# Patient Record
Sex: Female | Born: 1935 | Race: White | Hispanic: No | Smoking: Former smoker
Health system: Southern US, Community
[De-identification: ages and names within clinical notes are randomized; demographics above are authoritative.]

## PROBLEM LIST (undated history)

## (undated) DIAGNOSIS — M810 Age-related osteoporosis without current pathological fracture: Secondary | ICD-10-CM

## (undated) DIAGNOSIS — J45909 Unspecified asthma, uncomplicated: Secondary | ICD-10-CM

## (undated) DIAGNOSIS — M1990 Unspecified osteoarthritis, unspecified site: Secondary | ICD-10-CM

## (undated) DIAGNOSIS — E079 Disorder of thyroid, unspecified: Secondary | ICD-10-CM

## (undated) DIAGNOSIS — E119 Type 2 diabetes mellitus without complications: Secondary | ICD-10-CM

## (undated) DIAGNOSIS — J439 Emphysema, unspecified: Secondary | ICD-10-CM

## (undated) DIAGNOSIS — T7840XA Allergy, unspecified, initial encounter: Secondary | ICD-10-CM

## (undated) DIAGNOSIS — J449 Chronic obstructive pulmonary disease, unspecified: Secondary | ICD-10-CM

## (undated) HISTORY — DX: Disorder of thyroid, unspecified: E07.9

## (undated) HISTORY — DX: Allergy, unspecified, initial encounter: T78.40XA

## (undated) HISTORY — DX: Unspecified asthma, uncomplicated: J45.909

## (undated) HISTORY — DX: Emphysema, unspecified: J43.9

## (undated) HISTORY — PX: BREAST BIOPSY: SHX20

## (undated) HISTORY — PX: ABDOMINAL HYSTERECTOMY: SHX81

## (undated) HISTORY — PX: CHOLECYSTECTOMY: SHX55

## (undated) HISTORY — DX: Type 2 diabetes mellitus without complications: E11.9

## (undated) HISTORY — DX: Chronic obstructive pulmonary disease, unspecified: J44.9

## (undated) HISTORY — DX: Unspecified osteoarthritis, unspecified site: M19.90

## (undated) HISTORY — DX: Age-related osteoporosis without current pathological fracture: M81.0

---

## 1998-06-23 ENCOUNTER — Emergency Department (HOSPITAL_COMMUNITY): Admission: EM | Admit: 1998-06-23 | Discharge: 1998-06-23 | Payer: Self-pay | Admitting: Emergency Medicine

## 1998-06-23 ENCOUNTER — Encounter: Payer: Self-pay | Admitting: Emergency Medicine

## 2000-04-23 ENCOUNTER — Encounter: Payer: Self-pay | Admitting: *Deleted

## 2000-04-23 ENCOUNTER — Encounter: Admission: RE | Admit: 2000-04-23 | Discharge: 2000-04-23 | Payer: Self-pay | Admitting: *Deleted

## 2000-09-07 ENCOUNTER — Encounter: Payer: Self-pay | Admitting: Internal Medicine

## 2000-09-07 ENCOUNTER — Encounter: Admission: RE | Admit: 2000-09-07 | Discharge: 2000-09-07 | Payer: Self-pay | Admitting: Internal Medicine

## 2000-09-24 ENCOUNTER — Ambulatory Visit (HOSPITAL_COMMUNITY): Admission: RE | Admit: 2000-09-24 | Discharge: 2000-09-24 | Payer: Self-pay | Admitting: Gastroenterology

## 2001-04-17 ENCOUNTER — Ambulatory Visit (HOSPITAL_COMMUNITY): Admission: RE | Admit: 2001-04-17 | Discharge: 2001-04-17 | Payer: Self-pay | Admitting: Gastroenterology

## 2001-04-18 ENCOUNTER — Encounter: Payer: Self-pay | Admitting: Internal Medicine

## 2001-04-18 ENCOUNTER — Encounter: Admission: RE | Admit: 2001-04-18 | Discharge: 2001-04-18 | Payer: Self-pay | Admitting: Internal Medicine

## 2001-04-24 ENCOUNTER — Encounter: Payer: Self-pay | Admitting: Internal Medicine

## 2001-04-24 ENCOUNTER — Encounter: Admission: RE | Admit: 2001-04-24 | Discharge: 2001-04-24 | Payer: Self-pay | Admitting: Internal Medicine

## 2002-02-13 ENCOUNTER — Encounter: Admission: RE | Admit: 2002-02-13 | Discharge: 2002-02-13 | Payer: Self-pay | Admitting: Internal Medicine

## 2002-02-13 ENCOUNTER — Encounter: Payer: Self-pay | Admitting: Internal Medicine

## 2002-05-02 ENCOUNTER — Encounter: Payer: Self-pay | Admitting: Internal Medicine

## 2002-05-02 ENCOUNTER — Encounter: Admission: RE | Admit: 2002-05-02 | Discharge: 2002-05-02 | Payer: Self-pay | Admitting: Internal Medicine

## 2002-07-29 ENCOUNTER — Encounter: Admission: RE | Admit: 2002-07-29 | Discharge: 2002-07-29 | Payer: Self-pay | Admitting: Internal Medicine

## 2002-07-29 ENCOUNTER — Encounter: Payer: Self-pay | Admitting: Internal Medicine

## 2002-11-04 ENCOUNTER — Encounter: Payer: Self-pay | Admitting: Internal Medicine

## 2002-11-04 ENCOUNTER — Encounter: Admission: RE | Admit: 2002-11-04 | Discharge: 2002-11-04 | Payer: Self-pay | Admitting: Internal Medicine

## 2002-11-13 ENCOUNTER — Encounter: Payer: Self-pay | Admitting: Surgery

## 2002-11-13 ENCOUNTER — Encounter: Admission: RE | Admit: 2002-11-13 | Discharge: 2002-11-13 | Payer: Self-pay | Admitting: Surgery

## 2002-12-05 ENCOUNTER — Encounter: Payer: Self-pay | Admitting: Surgery

## 2002-12-12 ENCOUNTER — Encounter (INDEPENDENT_AMBULATORY_CARE_PROVIDER_SITE_OTHER): Payer: Self-pay | Admitting: Specialist

## 2002-12-12 ENCOUNTER — Encounter: Payer: Self-pay | Admitting: Surgery

## 2002-12-12 ENCOUNTER — Observation Stay (HOSPITAL_COMMUNITY): Admission: RE | Admit: 2002-12-12 | Discharge: 2002-12-13 | Payer: Self-pay | Admitting: Surgery

## 2003-05-04 ENCOUNTER — Encounter: Payer: Self-pay | Admitting: Internal Medicine

## 2003-05-04 ENCOUNTER — Encounter: Admission: RE | Admit: 2003-05-04 | Discharge: 2003-05-04 | Payer: Self-pay | Admitting: Internal Medicine

## 2004-05-04 ENCOUNTER — Encounter: Admission: RE | Admit: 2004-05-04 | Discharge: 2004-05-04 | Payer: Self-pay | Admitting: Internal Medicine

## 2004-06-01 ENCOUNTER — Encounter: Admission: RE | Admit: 2004-06-01 | Discharge: 2004-06-01 | Payer: Self-pay | Admitting: Internal Medicine

## 2004-06-06 ENCOUNTER — Encounter: Admission: RE | Admit: 2004-06-06 | Discharge: 2004-06-06 | Payer: Self-pay | Admitting: Internal Medicine

## 2004-06-06 ENCOUNTER — Encounter (INDEPENDENT_AMBULATORY_CARE_PROVIDER_SITE_OTHER): Payer: Self-pay | Admitting: *Deleted

## 2005-07-27 ENCOUNTER — Encounter: Admission: RE | Admit: 2005-07-27 | Discharge: 2005-07-27 | Payer: Self-pay | Admitting: Internal Medicine

## 2005-09-28 ENCOUNTER — Encounter: Admission: RE | Admit: 2005-09-28 | Discharge: 2005-12-27 | Payer: Self-pay | Admitting: Internal Medicine

## 2006-08-23 ENCOUNTER — Encounter: Admission: RE | Admit: 2006-08-23 | Discharge: 2006-08-23 | Payer: Self-pay | Admitting: Internal Medicine

## 2006-10-09 ENCOUNTER — Ambulatory Visit (HOSPITAL_COMMUNITY): Admission: RE | Admit: 2006-10-09 | Discharge: 2006-10-09 | Payer: Self-pay | Admitting: Obstetrics & Gynecology

## 2006-10-25 ENCOUNTER — Ambulatory Visit (HOSPITAL_COMMUNITY): Admission: RE | Admit: 2006-10-25 | Discharge: 2006-10-25 | Payer: Self-pay | Admitting: Cardiology

## 2006-11-01 ENCOUNTER — Inpatient Hospital Stay (HOSPITAL_BASED_OUTPATIENT_CLINIC_OR_DEPARTMENT_OTHER): Admission: RE | Admit: 2006-11-01 | Discharge: 2006-11-01 | Payer: Self-pay | Admitting: Cardiology

## 2006-12-14 ENCOUNTER — Encounter (INDEPENDENT_AMBULATORY_CARE_PROVIDER_SITE_OTHER): Payer: Self-pay | Admitting: Obstetrics & Gynecology

## 2006-12-14 ENCOUNTER — Ambulatory Visit (HOSPITAL_COMMUNITY): Admission: RE | Admit: 2006-12-14 | Discharge: 2006-12-15 | Payer: Self-pay | Admitting: Obstetrics & Gynecology

## 2007-08-27 ENCOUNTER — Encounter: Admission: RE | Admit: 2007-08-27 | Discharge: 2007-08-27 | Payer: Self-pay | Admitting: Internal Medicine

## 2007-10-31 IMAGING — CT CT HEART WO/W CTA ONLY W/ CA
1 of 3 series · 12 of 20 positions shown, 15 images · IV contrast (omnipaque)
Comparison: none

Addendum Begins
Original report by Dr. Horlalekan Poly.  Following addendum by Dr. Reuque on 10/25/06: 
OVER-READ INTERPRETATION - CHEST CT:
The following report provided by [REDACTED] is an over-read interpretation of the non-cardiac portion of this coronary CTA study.  This does not include interpretation of cardiac or coronary anatomy or pathology.
Extracardiac Findings:  Scarring in the lingula adjacent to the major fissure on the left.  Minimal scarring in the right middle lobe and both lower lobes.  Pulmonary parenchyma otherwise clear.  Thoracic aortic atherosclerosis without aneurysm.  Normal sized hilar lymph nodes; no significant lymphadenopathy within the visualized mediastinum.  Small hiatal hernia.  Visualized upper abdomen otherwise unremarkable for early arterial phase of enhancement.
CLINICAL DATA: 79-year-old white female with history of hypertension, hypercholesterolemia, and COPD.  She is being evaluated for clearance of oophorectomy for a large ovarian cyst.  She has abnormal ECG and had prior Cardiolite study, which showed significant breast attenuation artifact.  
CORONARY CT ANGIOGRAPHY WITH CALCIUM SCORE:
PREPARATION:  The patient was premedicated with a total of 15 mg of IV Lopressor.  She received 0.4 mg sublingual nitroglycerin.  Her heart rate during the study was 60 BPM and regular.
PROCEDURE:  The patient initially had AP and lateral topograms performed.  We then performed an unenhanced CT of the heart at 3-mm collimation for calcium scoring.  The patient next received a test bolus of 20 cc of Omnipaque 350 with the region of interest placed in the ascending aorta for timing purposes.  She next received 80 cc of Omnipaque for a coronary CT angiography with a saline Pool bolus.  Images were obtained with 0.6-mm collimation, 0.4-mm overlap, and a gantry rotation of 330 milliseconds.

[Series 11: 70% only · axial · 0.40mm/px · z∈[+950,+1042]mm · 12 of 269 slices shown, 15 images]
[im 20/269  vessel]
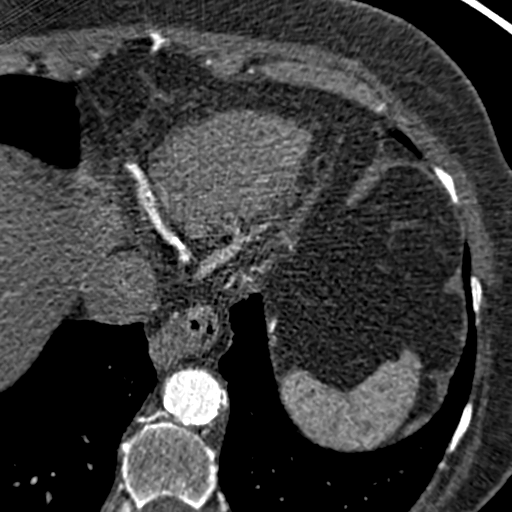
[im 20/269  lung]
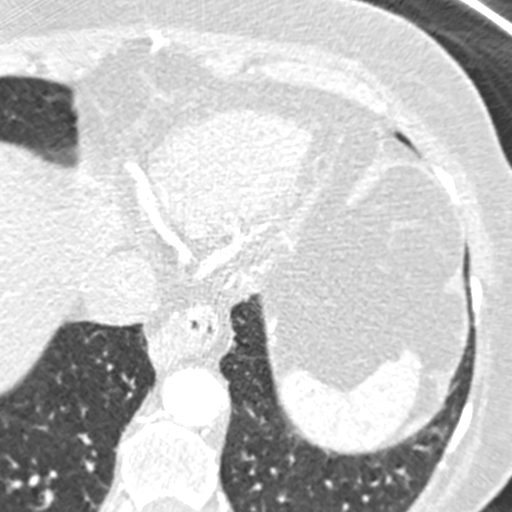
[im 39/269  vessel]
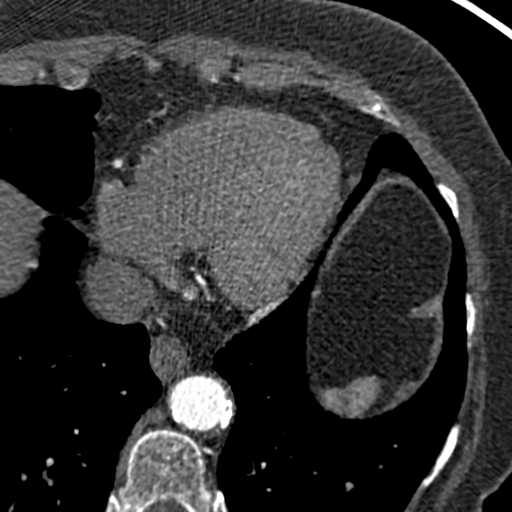
[im 58/269  vessel]
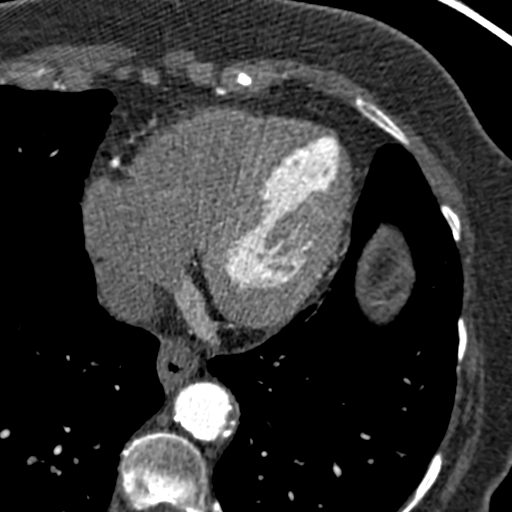
[im 77/269  vessel]
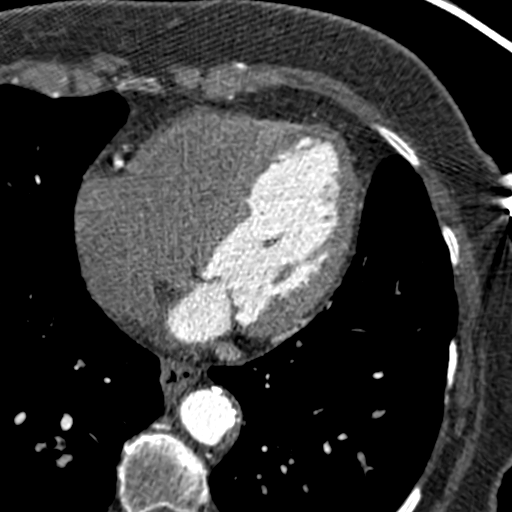
[im 96/269  vessel]
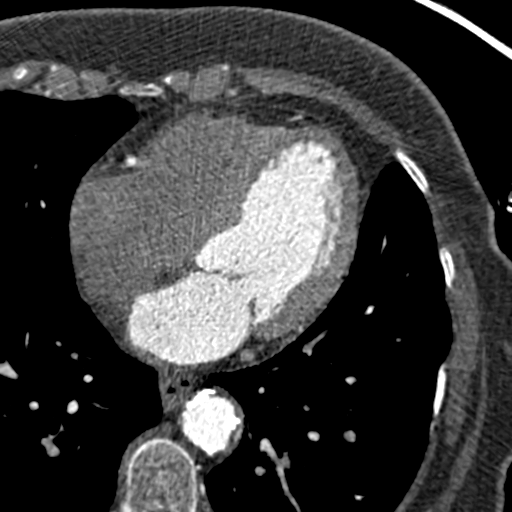
[im 96/269  lung]
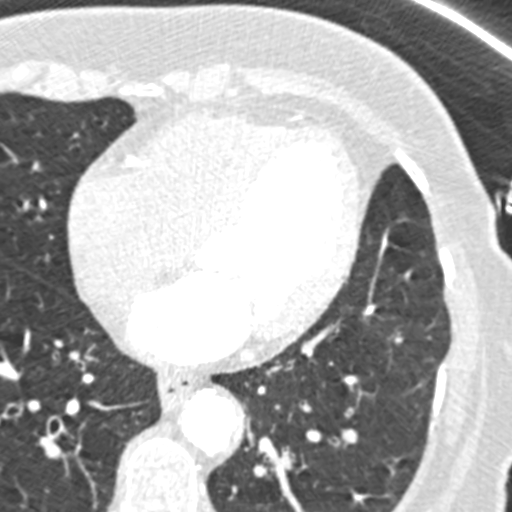
[im 115/269  vessel]
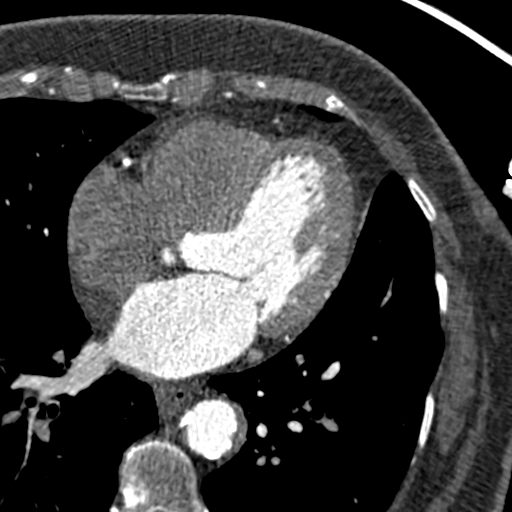
[im 154/269  vessel]
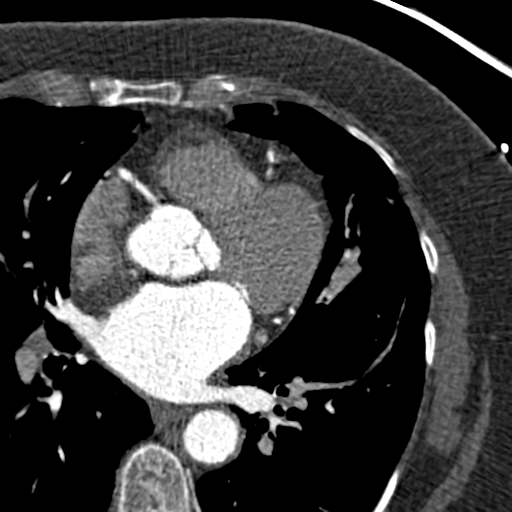
[im 173/269  vessel]
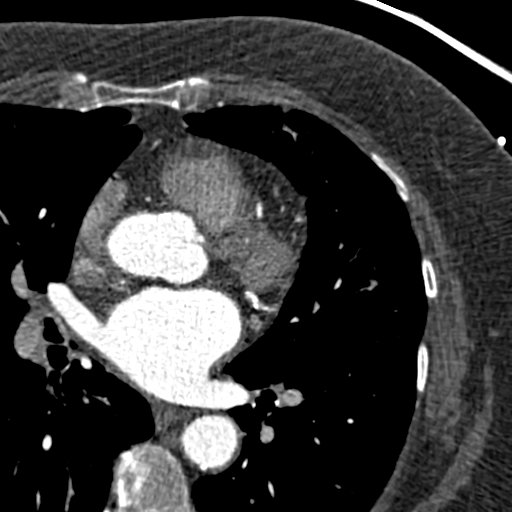
[im 192/269  vessel]
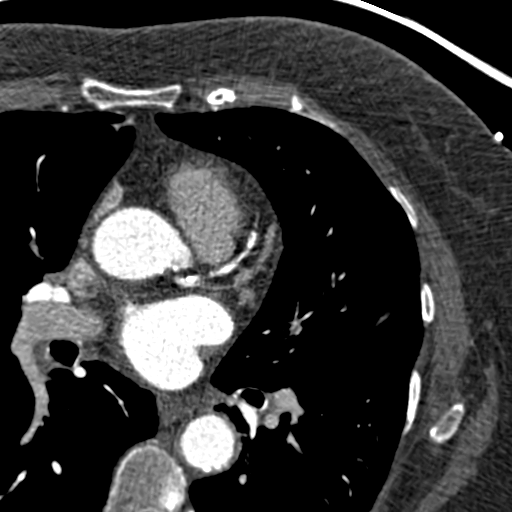
[im 192/269  lung]
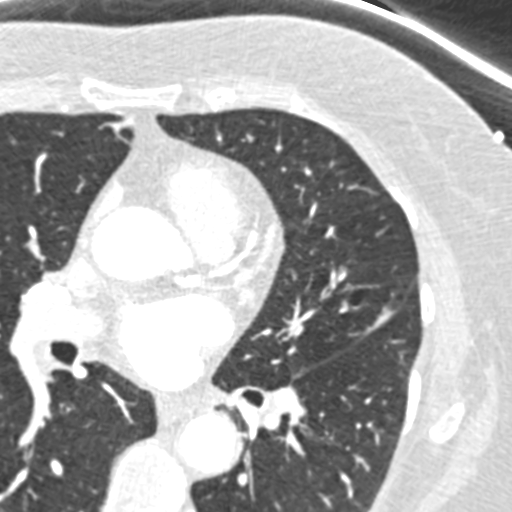
[im 211/269  vessel]
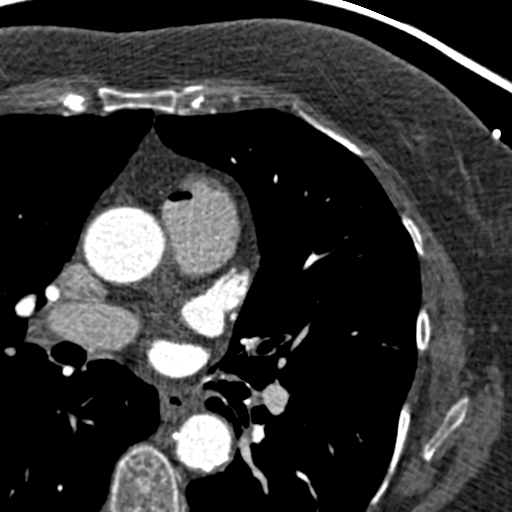
[im 230/269  vessel]
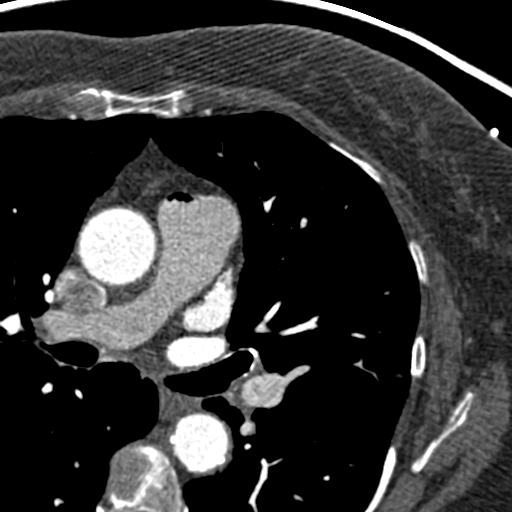
[im 249/269  vessel]
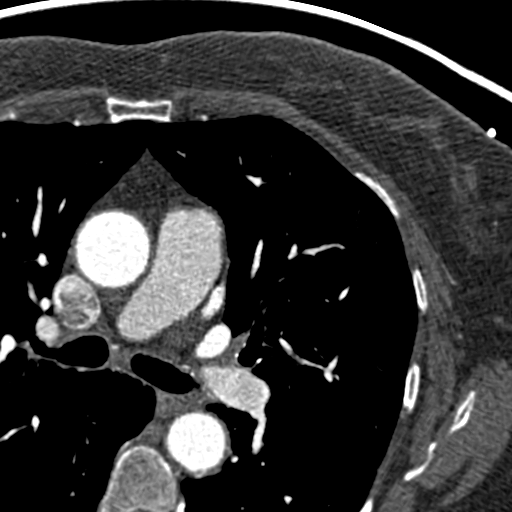

[12 of 20 positions shown; findings below may reference images not displayed]

IMPRESSION: 1.  Small hiatal hernia. 
2.  Thoracic aortic atherosclerosis without aneurysm.  
3.  Lingular scarring, with minimal scarring in the right middle lobe and both lower lobes.  

Addendum Ends
FINDINGS: The patient's calcium score was 579, which placed her in the greater to or equal to 75 percentile for her matched age.  Calcification was localized predominantly to the proximal LAD, the proximal left circumflex coronary artery, and the proximal and distal right coronary artery.
CORONARY ANATOMY:  The left coronary artery arises normally from the left coronary cusp.  The left main coronary has calcification in the ostium extending into the aortic annulus.  There is no significant plaque in the left main proper. 
The left anterior descending artery is a moderate-sized vessel, which extends out to the apex.  It has severe calcified plaque in the proximal vessel and in the mid vessel following the takeoff of the high diagonal branch.  The very proximal portion LAD clearly has a moderate amount of plaque.  Given the heavy calcification, it is really difficult to ascertain whether there is significant stenosis in this region.  Following the diagonal branch, there is also severely calcified plaque that appears to be moderate and nonobstructive.  The diagonal branch is of mild to moderate size and is without significant obstructive disease.
The left circumflex coronary artery is also severely calcified in the proximal vessel; however, this plaque appears to be nonobstructive.  It branches and gives rise to two moderate-sized marginal vessels that are without significant disease.  There is mild calcified plaque at the bifurcation of the marginal vessels, which again is nonobstructive. 
The right coronary artery arises normally from the right coronary cusp.  This is the large dominant vessel.  There is calcified nonobstructive plaque in the proximal vessel.  There is minor calcified plaque in the mid vessel.  At the distal vessel prior to the bifurcation of the PDA, there is heavily calcified plaque.  Again, this appears to be moderate but nonobstructive.  The PDA and posterolateral branches appear to be without significant disease.
Left ventricular analysis was performed using time volume analysis.  This demonstrates normal left ventricular size.  End-diastolic volume is 70 mL with an end-systolic volume of 16 mL.  Ejection fraction is calculated at 77%.  There is normal regional and global wall motion.
There is mild to moderate aortic annular calcification.  The patient has severe calcification of the proximal and descending aorta.  The aortic dimensions are normal.  There are normal pulmonary veins.  There is no pericardial thickening or effusion.  The aortic valve is trileaflet.
IMPRESSION: 1.  Atherosclerotic coronary artery disease.  The patient has fairly extensive calcified plaque involving the proximal LAD, proximal left circumflex coronary artery, and distal right coronary artery.  The proximal circumflex, mid LAD, and right coronary artery disease appears to be nonobstructive.  Significant stenosis could not be excluded in the very proximal LAD at an area of very severe calcification. 
2.  Normal left ventricular function. 
3.  Calcium score of 579%, placing the patient in the 75th percentile for match age and sex. 
4.  Calcified proximal and descending aorta.
5.  Otherwise, normal cardiac anatomy.
The noncardiac portions of this CT scan will be interpreted by Dr. Ljubijankic Nakic of [REDACTED] and added as an addendum to this report.

## 2008-01-08 ENCOUNTER — Encounter: Admission: RE | Admit: 2008-01-08 | Discharge: 2008-01-08 | Payer: Self-pay | Admitting: Internal Medicine

## 2008-03-02 ENCOUNTER — Ambulatory Visit: Admission: RE | Admit: 2008-03-02 | Discharge: 2008-03-02 | Payer: Self-pay | Admitting: Internal Medicine

## 2008-08-17 ENCOUNTER — Encounter: Admission: RE | Admit: 2008-08-17 | Discharge: 2008-08-17 | Payer: Self-pay | Admitting: Internal Medicine

## 2008-08-27 ENCOUNTER — Encounter: Admission: RE | Admit: 2008-08-27 | Discharge: 2008-08-27 | Payer: Self-pay | Admitting: Internal Medicine

## 2008-09-07 ENCOUNTER — Encounter: Admission: RE | Admit: 2008-09-07 | Discharge: 2008-09-07 | Payer: Self-pay | Admitting: Internal Medicine

## 2009-09-08 ENCOUNTER — Encounter: Admission: RE | Admit: 2009-09-08 | Discharge: 2009-09-08 | Payer: Self-pay | Admitting: Internal Medicine

## 2010-06-20 ENCOUNTER — Encounter: Admission: RE | Admit: 2010-06-20 | Discharge: 2010-06-20 | Payer: Self-pay | Admitting: Internal Medicine

## 2010-06-28 ENCOUNTER — Encounter: Admission: RE | Admit: 2010-06-28 | Discharge: 2010-06-28 | Payer: Self-pay | Admitting: Internal Medicine

## 2010-08-14 ENCOUNTER — Encounter: Payer: Self-pay | Admitting: Internal Medicine

## 2010-08-22 ENCOUNTER — Other Ambulatory Visit: Payer: Self-pay | Admitting: Internal Medicine

## 2010-08-22 DIAGNOSIS — Z1239 Encounter for other screening for malignant neoplasm of breast: Secondary | ICD-10-CM

## 2010-09-09 ENCOUNTER — Ambulatory Visit
Admission: RE | Admit: 2010-09-09 | Discharge: 2010-09-09 | Disposition: A | Discharge status: Home / Self Care | Payer: Medicare Other | Source: Ambulatory Visit | Attending: Internal Medicine | Admitting: Internal Medicine

## 2010-09-09 DIAGNOSIS — Z1239 Encounter for other screening for malignant neoplasm of breast: Secondary | ICD-10-CM

## 2010-12-06 NOTE — Op Note (Signed)
NAMEMARVIN, MAENZA               ACCOUNT NO.:  [id number]   MEDICAL RECORD NO.:  [id number]          PATIENT TYPE:  AMB   LOCATION:  DAY                          FACILITY:  [facility]   PHYSICIAN:  Genia Del, M.D.DATE OF BIRTH:  07-28-1926   DATE OF PROCEDURE:  12/14/2006  DATE OF DISCHARGE:                               OPERATIVE REPORT   PREOPERATIVE DIAGNOSIS:  Bilateral ovarian cysts.   POSTOPERATIVE DIAGNOSIS:  Bilateral ovarian cysts, on frozen section  benign ovarian cyst probably as mucinous and Brenner on the right ovary.  Left benign ovarian cyst, also paratubal.   PROCEDURE:  Bilateral salpingo-oophorectomy assisted with da Vinci  robot.   ASSISTANT:  Chester Holstein. Earlene Plater, M.D.   PROCEDURE:  Under general anesthesia with endotracheal intubation, the  patient is in lithotomy position.  She is prepped with Betadine on the  abdominal suprapubic vulvar and vaginal areas.  The bladder is  catheterized and the Foley is left in place, the patient is draped as  usual.  The patient is status post hysterectomy.  We go abdominally.  We  measure the first port at 28 cm from the symphysis pubis.  We infiltrate  with Marcaine 0.25% plain.  We make an incision with a scalpel over 1.5  cm, opened the aponeurosis under direct vision with the Mayo scissors  and opened the parietal peritoneum bluntly with a finger.  A pursestring  stitch of 0 Vicryl is done at the aponeurosis.  We then insert the  [place] and the camera at that level.  We inspect the abdominopelvic  cavity.  No adhesions are seen with the abdominal wall anywhere.  We,  therefore, measure all the other ports and introduce the trocars under  direct vision.  Three robotic trocars and one assistant trocars.  We  then dock the robot as usual without any difficulty and insert the  instruments and Endo shears scissors in the right hand, the bipolar  fenestrated clamp in the left hand, and a fenestrated clamp in the third  robotic arm.  We start with peritoneal washings.  We then visualize the  ovaries.  The right side has a small cyst and the left side had a larger  ovarian cyst which is about 8 cm.  We start on the right side.  We  visualize the right ureter which is in normal anatomic position.  We  cauterize and section the right infundibulopelvic ligament and reach  under the ovary all the way to detach the abdomen from the pelvic wall.  We then keep the clamp in the third arm as we proceed with the left  salpingo-oophorectomy.  We visualize the left ureter which is in normal  anatomic position.  We cauterize and section the left infundibulopelvic  ligament and continue under the ovary until the ovary and tubes are  completely detached from the wall.  No adhesion was present on either  side.  The procedure went very easily.  Hemostasis is adequate on both  sides.  We used a small bag for the right ovary and we bring it out with  a bag  through the assistant port.  We used the renal bag for the larger  left ovary and bring it out also through the assistant port.  Both are  sent for frozen section.  The results is benign ovarian and paratubal  cyst.  The definitive pathology is pending.  We then close the  aponeurosis at the level of the assistant ports with a 0 Vicryl in a  pursestring stitch.  We then put the camera in place with CO2 inflation  and we visualized all ports as the trocars are pulled out.  Hemostasis  is good at all levels.  We then attach the 0 Vicryl at the camera port  and we closed the skin at each incision with a Vicryl 4-0.  We add  Dermabond on each incision.  Hemostasis is adequate at all levels.  The  estimated blood loss was minimal.  The count of instruments and sponges  was complete.  The patient remained stable throughout the surgery and  was brought to recovery room in good stable status.      Genia Del, M.D.  Electronically Signed     ML/MEDQ  D:  12/14/2006   T:  12/14/2006  Job:  [id number]

## 2010-12-09 NOTE — Procedures (Signed)
. [facility]  Patient:    Diamond Holland, Diamond Holland Visit Number: [id number] MRN: [id number]          Service Type: END Location: ENDO Attending Physician:  Charna Elizabeth Dictated by:   Anselmo Rod, M.D. Proc. Date: 04/17/01 Admit Date:  04/17/2001   CC:         Lilly Cove, M.D.   Procedure Report  ATE OF BIRTH:  [birth year].  PROCEDURE:  Screening colonoscopy.  ENDOSCOPIST:  Anselmo Rod, M.D.  INSTRUMENT USED:  Olympus video colonoscope.  INDICATION FOR PROCEDURE:  A 77 year old white female undergoing screening colonoscopy.  Rule out colonic polyps, masses, hemorrhoids, etc.  PREPROCEDURE PREPARATION:  Informed consent was procured from the patient. The patient was fasted for eight hours prior to the procedure and prepped with a bottle of magnesium citrate and a gallon of NuLytely the night prior to the procedure.  PREPROCEDURE PHYSICAL:  VITAL SIGNS:  The patient had stable vital signs.  NECK:  Supple.  CHEST:  Clear to auscultation.  S1, S2 regular.  ABDOMEN:  Soft with normal bowel sounds.  DESCRIPTION OF PROCEDURE:  The patient was placed in the left lateral decubitus position and sedated with 50 mg of Demerol and 5 mg of Versed intravenously.  Once the patient was adequately sedate and maintained on low-flow oxygen and continuous cardiac monitoring, the Olympus video colonoscope was advanced from the rectum to the cecum with some difficulty secondary to a large amount of residual stool in the colon.  Multiple washes were done.  There was evidence of pandiverticular disease with diverticula in different stages of formation throughout the colon.  There was inspissated stool in several of the diverticular pockets.  Small internal hemorrhoids were seen on retroflexion in the rectum, and small external hemorrhoids were appreciated on anal inspection.  The appendiceal orifice and the ileocecal valve were clearly visualized  and photographed.  The patient tolerated the procedure well without complication.  IMPRESSION: 1. Small, nonbleeding internal and external hemorrhoids. 2. Pandiverticulosis. 3. No masses or polyps seen. 4. Large amount of residual stool in the colon, very small lesions could have    been missed.  RECOMMENDATIONS: 1. Repeat colorectal cancer screening is recommended in the next five to 10    years unless the patient were to develop any abnormal symptoms in the    interim. 2. A high-fiber diet has been discussed with the patient in great detail. 3. Follow-up is advised on a p.r.n. basis. Dictated by:   Anselmo Rod, M.D. Attending Physician:  Charna Elizabeth DD:  04/17/01 TD:  04/17/01 Job: [id number]

## 2010-12-09 NOTE — H&P (Signed)
Diamond Holland, Diamond Holland NO.:  [id number]   MEDICAL RECORD NO.:  [id number]           PATIENT TYPE:   LOCATION:                                 FACILITY:   PHYSICIAN:  Peter M. Swaziland, M.D.  DATE OF BIRTH:  17-Jul-1927   DATE OF ADMISSION:  11/01/2006  DATE OF DISCHARGE:                              HISTORY & PHYSICAL   HISTORY OF PRESENT ILLNESS:  Diamond Holland is a pleasant 77 year old white  female who is seen for preoperative evaluation for oophorectomy for a  large ovarian cyst.  She has a history of hypertension,  hypercholesterolemia and COPD.  She had previously been evaluated by a  adenosine Cardiolite study in September 2005 which was somewhat  equivocal since she did have large breast attenuation artifact  anteriorly.  We subsequently recommended evaluation with a cardiac CT  angiography.  This demonstrated a high calcium score of 579.  She had  fairly extensive calcified plaque in the proximal LAD, proximal  circumflex and right coronary artery.  Much of this was able to be  demonstrated to be nonobstructive however given the extensive  calcification the particularly proximal to mid LAD this area was really  not amenable to evaluation for potential stenosis.  She did have normal  left ventricular function.  Based on the findings of high calcium score  and fairly extensive coronary disease and the fact that the proximal LAD  could not be fully assessed by cardiac CT we have recommended proceeding  with diagnostic cardiac catheterization.   PAST MEDICAL HISTORY:  She has a history of prior hysterectomy and  tonsillectomy.  She has a history of tobacco abuse but quit in 2001.  She has history of COPD, hypertension, hypercholesterolemia.   SOCIAL HISTORY:  She is married.  She has three children.  She quit  smoking in 2001.  She denies alcohol use.   FAMILY HISTORY:  Father died at age 48 of cancer.  He may have also had  a myocardial infarction and  apparently did have significant angina.  Mother died at age 39 of congestive heart failure.  She also had several  mini strokes.  One brother died at age 32 with cancer.  One sister is  alive and well.   REVIEW OF SYSTEMS:  Otherwise unremarkable.   PHYSICAL EXAM:  GENERAL:  The patient is a pleasant white female in no  apparent distress.  VITAL SIGNS:  Weight is 158, blood pressure 128/70, pulse is 80 and  regular.  NECK:  She has no jugular venous distension or bruits.  LUNGS:  Lungs were clear to auscultation and percussion.  CARDIAC:  Exam reveals regular rate and rhythm without gallop, murmur,  rub or click.  ABDOMEN:  Abdomen is soft, nontender without mass or bruits.  EXTREMITIES:  Without edema.  Pulses were 2+ and symmetric.  NEUROLOGIC:  Neurologic exam is nonfocal.   LABORATORY DATA:  ECG shows COPD with no active disease.  ECG at rest  shows normal sinus rhythm with evidence of possible anterior infarct age  undetermined.   IMPRESSION:  1. Coronary  artery disease as documented by cardiac CT angiography.      Extent of stenosis in the LAD is unknown.  2. High coronary calcium score.  3. Hypertension.  4. Hypercholesterolemia.  5. COPD with prior tobacco abuse.  6. Large ovarian cyst.   PLAN:  Will proceed with diagnostic cardiac catheterization with further  therapy pending these results.           ______________________________  Peter M. [place]aziland, M.D.     PMJ/MEDQ  D:  10/30/2006  T:  10/31/2006  Job:  [id number]   cc:   Ralene Ok, M.D.

## 2010-12-09 NOTE — Cardiovascular Report (Signed)
Diamond Holland, Diamond Holland NO.:  [id number]   MEDICAL RECORD NO.:  [id number]          PATIENT TYPE:  OIB   LOCATION:  NA                           FACILITY:  [facility]   PHYSICIAN:  Peter M. Swaziland, M.D.  DATE OF BIRTH:  [birth year]   DATE OF PROCEDURE:  11/01/2006  DATE OF DISCHARGE:                            CARDIAC CATHETERIZATION   INDICATION FOR PROCEDURE:  A 77 year old white female who is being  evaluated for preoperative clearance for oophorectomy for a large  ovarian cyst.  She does have risk factors of hypertension and  hypercholesterolemia.  Cardiac CT angiography demonstrates extensive  calcified plaque in all three coronaries.  The proximal portion of the  LAD was really not able to be evaluated due to the extensive  calcification.   PROCEDURES:  Left heart catheterization, coronary and left ventricular  angiography.   EQUIPMENT USED:  A 4-French 4 cm left Judkins catheter, 4-French 3-DRC  catheter, 4-French pigtail catheter, 4-French arterial sheath.   MEDICATIONS:  Local anesthesia with 1% Xylocaine, contrast 90 cc of  Omnipaque.   HEMODYNAMIC DATA:  Aortic pressure is 138/63 with a mean of 95 mmHg.  Left ventricle pressure is 132 with a EDP of 24 mmHg.   ANGIOGRAPHIC DATA:  The left coronary arises and distributes normally.  The left main coronary artery appears normal.   The left anterior descending artery is moderately calcified in the  proximal to mid vessel.  There is 10-20% narrowing in the proximal and  mid vessel without significant obstructive disease.  The first diagonal  branch appears normal.   The left circumflex coronary is also calcified proximally.  It has 10-  20% narrowing proximally.   The right coronary is a large dominant vessel and has a 20% focal  narrowing in the proximal to mid vessel.   Left ventricular angiography was performed in the RAO view.  This  demonstrates normal left ventricular size and contractility with  normal  systolic function.  Ejection fraction is estimated at 65%.  There is no  mitral regurgitation or prolapse.   FINAL INTERPRETATION:  1. Nonobstructive atherosclerotic coronary artery disease with      coronary calcification.  2. Normal left ventricular function.   PLAN:  Would continue risk factor modification.  Based on these results,  the patient is cleared to proceed with oophorectomy and has a low  surgical risk.           ______________________________  Peter M. [place], M.D.     PMJ/MEDQ  D:  11/01/2006  T:  11/01/2006  Job:  [id number]   cc:   Ralene Ok, M.D.  Genia Del, M.D.

## 2010-12-09 NOTE — Op Note (Signed)
NAMEGABRYELA, Diamond Holland                         ACCOUNT NO.:  [id number]   MEDICAL RECORD NO.:  [id number]                   PATIENT TYPE:  AMB   LOCATION:  DAY                                  FACILITY:  [facility]   PHYSICIAN:  Currie Paris, M.D.           DATE OF BIRTH:  02/12/1927   DATE OF PROCEDURE:  12/12/2002  DATE OF DISCHARGE:                                 OPERATIVE REPORT   OFFICE MEDICAL RECORD NUMBER:  [id number]   PREOPERATIVE DIAGNOSIS:  Chronic calculus cholecystitis.   POSTOPERATIVE DIAGNOSIS:  Chronic calculus cholecystitis.   OPERATION:  Laparoscopic cholecystectomy.   SURGEON:  Currie Paris, M.D.   ASSISTANT:  Sheppard Plumber. Earlene Plater, M.D.   ANESTHESIA:  General endotracheal.   CLINICAL HISTORY:  The patient is a 77 year old lady with biliary and reflux  type symptoms, who was known to gallstones.  She elected to proceed to  laparoscopic cholecystectomy.   DESCRIPTION OF PROCEDURE:  The patient seen in the holding area and had no  further questions.  She was then taken to the operating room and after  satisfactory general endotracheal anesthesia had been obtained, the abdomen  was prepped and draped.  Marcaine 0.25% plain was used for each incision.  The umbilical incision was made first, fascia opened, the peritoneal cavity  entered under direct vision.  A pursestring was placed, the Hasson  introduced, and the abdomen insufflated to 15.   The abdomen appeared basically normal.  A 10-11 trocar was placed in the  epigastrium, and two 5 mm laterally.  The gallbladder was retracted over the  liver and the peritoneum over the cystic duct and then the triangle of Calot  opened both anteriorly and posteriorly.  The cystic duct and cystic artery  were readily identified, dissected out, and a window made behind both of  them to be sure there were no other structures back there.  The cystic  artery was clipped once as was the cystic duct being clipped near  its  junction with the gallbladder.  The cystic duct was opened, and a Cook  catheter introduced percutaneously and a cholangiography done.  The  cholangiogram basically appeared normal with good filling of the duodenum.  No filling defects in the common duct.  Filling of the right and left  hepatic ducts and a little bit of filling of the pancreatic duct.   The catheter was removed and three clips placed on the cystic duct stay  side, and it was divided.  The cystic artery had two additional clips  placed, and it was divided, leaving two on the stay side.  A single clip was  placed on what appeared to be a tiny posterior branch.  The gallbladder was  then removed from below to above with the coagulation current of the  cautery.  Just prior to disconnecting, we irrigated and made sure the bed of  the gallbladder was dry, and  there was no bleeding or bile leaks, and  everything appeared okay.   The gallbladder was disconnected and brought out the umbilical port.  The  abdomen was reinsufflated, a final check made for hemostasis.  Again,  everything looked fine.  The lateral ports were removed under direct vision.  The umbilical port was removed and the pursestring tied down.  The abdomen  was deflated through the epigastric port.  The skin was closed with 4-0  Monocryl subcuticular plus Steri-Strips.   The patient tolerated the procedure well.  There were no operative  complications, and all counts were correct.                                               Currie Paris, M.D.    CJS/MEDQ  D:  12/12/2002  T:  12/12/2002  Job:  [id number]   cc:   Wilson Singer, M.D.  [address]  [place]  [place] [address]  Fax: [phone]

## 2010-12-09 NOTE — Procedures (Signed)
[place]  Patient:    Diamond Holland, Diamond Holland                      MRN: [id number] Proc. Date: 09/24/00 Adm. Date:  [id number] Attending:  Charna Elizabeth CC:         Lilly Cove, M.D.   Procedure Report  DATE OF BIRTH:  Jan 24, 1927.  PROCEDURE:  Esophagogastroduodenoscopy.  ENDOSCOPIST:  Anselmo Rod, M.D.  INSTRUMENT USED:  Olympus video panendoscope.  INDICATION FOR PROCEDURE:  Dysphagia with a stricture seen on barium swallow in a 77 year old white female.  Dilatation is planned if needed.  PREPROCEDURE PREPARATION:  Informed consent was procured from the patient. The patient was fasted for eight hours prior to the procedure.  PREPROCEDURE PHYSICAL:  VITAL SIGNS:  The patient had stable vital signs.  NECK:  Supple.  CHEST:  Clear to auscultation.  S1, S2 regular.  ABDOMEN:  Soft with normal abdominal bowel sounds.  DESCRIPTION OF PROCEDURE:  The patient was placed in the left lateral decubitus position and sedated with 25 mg of Demerol and 4 mg of Versed intravenously.  Once the patient was adequately sedate and maintained on low-flow oxygen and continuous cardiac monitoring, the Olympus video panendoscope was advanced through the mouthpiece, over the tongue, into the esophagus under direct vision.  The entire esophagus was widely patent.  There was a wide-open Schatzkis ring seen at the Z-line.  The scope was then advanced into the stomach, where a large hiatal hernia was seen on retroflexion.  The rest of the gastric mucosa and the proximal small bowel appeared normal.  No dilatation was needed, and therefore it was not done.  IMPRESSION: 1. Widely patent esophagus. 2. Wide-open Schatzkis ring at Z-line. 3. Large hiatal hernia. 4. Normal-appearing stomach and proximal small bowel.  RECOMMENDATIONS: 1. Continue PPI for now. 2. Follow all antireflux measures and avoid nonsteroidals. 3. Outpatient follow-up in the next two  weeks for further recommendations. DD:  09/24/00 TD:  09/24/00 Job: [id number]

## 2011-03-08 ENCOUNTER — Other Ambulatory Visit: Payer: Self-pay

## 2011-04-19 ENCOUNTER — Other Ambulatory Visit: Payer: Self-pay

## 2011-08-09 ENCOUNTER — Other Ambulatory Visit: Payer: Self-pay | Admitting: Internal Medicine

## 2011-08-09 DIAGNOSIS — Z1231 Encounter for screening mammogram for malignant neoplasm of breast: Secondary | ICD-10-CM

## 2011-09-20 ENCOUNTER — Ambulatory Visit
Admission: RE | Admit: 2011-09-20 | Discharge: 2011-09-20 | Disposition: A | Discharge status: Home / Self Care | Payer: Medicare Other | Source: Ambulatory Visit | Attending: Internal Medicine | Admitting: Internal Medicine

## 2011-09-20 DIAGNOSIS — Z1231 Encounter for screening mammogram for malignant neoplasm of breast: Secondary | ICD-10-CM

## 2012-06-13 ENCOUNTER — Other Ambulatory Visit: Payer: Self-pay | Admitting: Internal Medicine

## 2012-06-13 ENCOUNTER — Ambulatory Visit
Admission: RE | Admit: 2012-06-13 | Discharge: 2012-06-13 | Disposition: A | Discharge status: Home / Self Care | Payer: Medicare Other | Source: Ambulatory Visit | Attending: Internal Medicine | Admitting: Internal Medicine

## 2012-06-13 DIAGNOSIS — J449 Chronic obstructive pulmonary disease, unspecified: Secondary | ICD-10-CM

## 2012-06-13 DIAGNOSIS — R05 Cough: Secondary | ICD-10-CM

## 2012-08-05 ENCOUNTER — Ambulatory Visit (INDEPENDENT_AMBULATORY_CARE_PROVIDER_SITE_OTHER): Payer: Medicare Other | Admitting: Family Medicine

## 2012-08-05 VITALS — BP 166/72 | HR 98 | Temp 98.7°F | Resp 16 | Ht 62.0 in | Wt 153.0 lb

## 2012-08-05 DIAGNOSIS — S61409A Unspecified open wound of unspecified hand, initial encounter: Secondary | ICD-10-CM

## 2012-08-05 DIAGNOSIS — Z23 Encounter for immunization: Secondary | ICD-10-CM

## 2012-08-05 NOTE — Patient Instructions (Addendum)
Followup with the PA this weekend as planned.

## 2012-08-05 NOTE — Progress Notes (Signed)
Subjective: Patient fell and hit her hand against a rail. She got cut on the back of the hand. She does not know when she had her last tetanus shot.  Objective: Somewhat irregular laceration about 5 cm in length across the back of her hand. It is a skin tear, but it is a little deeper than some. This was repaired with Steri-Strips by the PA Benny Lennert.  Assessment: Wound wrist  Plan: Followup with the PA as per plan

## 2012-08-09 ENCOUNTER — Other Ambulatory Visit: Payer: Self-pay | Admitting: Internal Medicine

## 2012-08-09 DIAGNOSIS — Z1231 Encounter for screening mammogram for malignant neoplasm of breast: Secondary | ICD-10-CM

## 2012-09-20 ENCOUNTER — Ambulatory Visit
Admission: RE | Admit: 2012-09-20 | Discharge: 2012-09-20 | Disposition: A | Discharge status: Home / Self Care | Payer: Medicare Other | Source: Ambulatory Visit | Attending: Internal Medicine | Admitting: Internal Medicine

## 2012-09-20 DIAGNOSIS — Z1231 Encounter for screening mammogram for malignant neoplasm of breast: Secondary | ICD-10-CM

## 2013-08-20 ENCOUNTER — Other Ambulatory Visit: Payer: Self-pay

## 2013-08-20 DIAGNOSIS — Z1231 Encounter for screening mammogram for malignant neoplasm of breast: Secondary | ICD-10-CM

## 2013-09-22 ENCOUNTER — Ambulatory Visit
Admission: RE | Admit: 2013-09-22 | Discharge: 2013-09-22 | Disposition: A | Discharge status: Home / Self Care | Payer: Self-pay | Source: Ambulatory Visit

## 2013-09-22 DIAGNOSIS — Z1231 Encounter for screening mammogram for malignant neoplasm of breast: Secondary | ICD-10-CM

## 2013-10-08 ENCOUNTER — Other Ambulatory Visit: Payer: Self-pay

## 2013-10-08 DIAGNOSIS — Z1231 Encounter for screening mammogram for malignant neoplasm of breast: Secondary | ICD-10-CM

## 2014-09-29 ENCOUNTER — Ambulatory Visit
Admission: RE | Admit: 2014-09-29 | Discharge: 2014-09-29 | Disposition: A | Discharge status: Home / Self Care | Payer: Medicare HMO | Source: Ambulatory Visit

## 2014-09-29 DIAGNOSIS — Z1231 Encounter for screening mammogram for malignant neoplasm of breast: Secondary | ICD-10-CM

## 2015-05-31 ENCOUNTER — Ambulatory Visit (INDEPENDENT_AMBULATORY_CARE_PROVIDER_SITE_OTHER): Payer: Medicare HMO | Admitting: Family Medicine

## 2015-05-31 VITALS — BP 114/60 | HR 97 | Temp 98.3°F | Resp 20 | Ht 62.0 in | Wt 158.0 lb

## 2015-05-31 DIAGNOSIS — R739 Hyperglycemia, unspecified: Secondary | ICD-10-CM | POA: Diagnosis not present

## 2015-05-31 DIAGNOSIS — R062 Wheezing: Secondary | ICD-10-CM

## 2015-05-31 DIAGNOSIS — J441 Chronic obstructive pulmonary disease with (acute) exacerbation: Secondary | ICD-10-CM

## 2015-05-31 LAB — POCT CBC
GRANULOCYTE PERCENT: 66.6 % (ref 37–80)
HEMATOCRIT: 40.2 % (ref 37.7–47.9)
Hemoglobin: 13.7 g/dL (ref 12.2–16.2)
LYMPH, POC: 2.5 (ref 0.6–3.4)
MCH, POC: 29.9 pg (ref 27–31.2)
MCHC: 34.2 g/dL (ref 31.8–35.4)
MCV: 87.3 fL (ref 80–97)
MID (CBC): 1.1 — AB (ref 0–0.9)
MPV: 7 fL (ref 0–99.8)
POC GRANULOCYTE: 7.3 — AB (ref 2–6.9)
POC LYMPH %: 23 % (ref 10–50)
POC MID %: 10.4 %M (ref 0–12)
Platelet Count, POC: 268 10*3/uL (ref 142–424)
RBC: 4.6 M/uL (ref 4.04–5.48)
RDW, POC: 14 %
WBC: 10.9 10*3/uL — AB (ref 4.6–10.2)

## 2015-05-31 LAB — GLUCOSE, POCT (MANUAL RESULT ENTRY): POC GLUCOSE: 95 mg/dL (ref 70–99)

## 2015-05-31 MED ORDER — ALBUTEROL SULFATE (2.5 MG/3ML) 0.083% IN NEBU
2.5000 mg | INHALATION_SOLUTION | Freq: Once | RESPIRATORY_TRACT | Status: AC
  Administered 2015-05-31: 2.5 mg via RESPIRATORY_TRACT

## 2015-05-31 MED ORDER — ALBUTEROL SULFATE HFA 108 (90 BASE) MCG/ACT IN AERS: 2.0000 | INHALATION_SPRAY | RESPIRATORY_TRACT | Status: DC | PRN

## 2015-05-31 MED ORDER — AMOXICILLIN 875 MG PO TABS
875.0000 mg | ORAL_TABLET | Freq: Two times a day (BID) | ORAL | Status: DC
Start: 2015-05-31 — End: 2023-01-06

## 2015-05-31 MED ORDER — PREDNISONE 20 MG PO TABS: ORAL_TABLET | ORAL | Status: DC

## 2015-05-31 NOTE — Patient Instructions (Signed)
Take Amoxicillin 875 twice daily for 10 days  Use Albuterol inhaler 2 puffs 4 times daily as needed for cough and wheeze  Take Prednisone 2 daily for 3 days, then 1 for 3 days, then 1/2 daily.  Take after morning meal.  Return or see your primary care doctor as needed

## 2015-05-31 NOTE — Progress Notes (Signed)
Patient ID: Diamond Holland, female    DOB: 1927/03/12  Age: 81 y.o. MRN: [id number]  Chief Complaint  Patient presents with  . Cough    Onset 2 weeks  . Sinusitis  . Back Pain    Left side onset 2-3 days  . Fatigue    Onset 1 week    Subjective:   2 wk history of congestion.  2 rounds of mucinex cold/flu.  Wheezes.  Coughing yellow phlegm.  History of COPD.  No fever.  Runny nose.  No ST or ear problem.  Uses symbicort.  Lives alone.  Children all sick.  Current allergies, medications, problem list, past/family and social histories reviewed.  Objective:  BP 114/60 mmHg  Pulse 97  Temp(Src) 98.3 F (36.8 C) (Oral)  Resp 20  Ht  (1.575 m)  Wt 158 lb (71.668 kg)  BMI 28.89 kg/m2  SpO2 96% NAD.  Hearing aids.  Throat clear.  Rhinorrhea.  No nodes.  Chest soft wheeze, rales, rhocnci.  Results for orders placed or performed in visit on 05/31/15  POCT CBC  Result Value Ref Range   WBC 10.9 (A) 4.6 - 10.2 K/uL   Lymph, poc 2.5 0.6 - 3.4   POC LYMPH PERCENT 23.0 10 - 50 %L   MID (cbc) 1.1 (A) 0 - 0.9   POC MID % 10.4 0 - 12 %M   POC Granulocyte 7.3 (A) 2 - 6.9   Granulocyte percent 66.6 37 - 80 %G   RBC 4.60 4.04 - 5.48 M/uL   Hemoglobin 13.7 12.2 - 16.2 g/dL   HCT, POC 62.1 30.8 - 47.9 %   MCV 87.3 80 - 97 fL   MCH, POC 29.9 27 - 31.2 pg   MCHC 34.2 31.8 - 35.4 g/dL   RDW, POC 65.7 %   Platelet Count, POC 268 142 - 424 K/uL   MPV 7.0 0 - 99.8 fL  POCT glucose (manual entry)  Result Value Ref Range   POC Glucose 95 70 - 99 mg/dl     Assessment & Plan:   Assessment: 1. COPD with exacerbation (HCC)   2. Wheezing   3. Hyperglycemia       Plan: Neb treatment then recheck.  Rhonchi now, but wheezes cleared  Orders Placed This Encounter  Procedures  . POCT CBC  . POCT glucose (manual entry)    Meds ordered this encounter  Medications  . albuterol (PROVENTIL) (2.5 MG/3ML) 0.083% nebulizer solution 2.5 mg    Sig:   . amoxicillin (AMOXIL) 875 MG  tablet    Sig: Take 1 tablet (875 mg total) by mouth 2 (two) times daily.    Dispense:  20 tablet    Refill:  0  . albuterol (PROVENTIL HFA;VENTOLIN HFA) 108 (90 BASE) MCG/ACT inhaler    Sig: Inhale 2 puffs into the lungs every 4 (four) hours as needed for wheezing or shortness of breath (cough, shortness of breath or wheezing.).    Dispense:  1 Inhaler    Refill:  1  . predniSONE (DELTASONE) 20 MG tablet    Sig: Take 2 pills daily for 3 days, then 1 daily for 3 day, then 1/2 daily for 4 days    Dispense:  11 tablet    Refill:  0         Patient Instructions  Take Amoxicillin 875 twice daily for 10 days  Use Albuterol inhaler 2 puffs 4 times daily as needed for cough and wheeze  Take Prednisone  2 daily for 3 days, then 1 for 3 days, then 1/2 daily.  Take after morning meal.  Return or see your primary care doctor as needed     Return if symptoms worsen or fail to improve.   Azariya Freeman, MD 05/31/2015

## 2015-08-30 ENCOUNTER — Other Ambulatory Visit: Payer: Self-pay

## 2015-08-30 DIAGNOSIS — Z1231 Encounter for screening mammogram for malignant neoplasm of breast: Secondary | ICD-10-CM

## 2015-09-30 ENCOUNTER — Ambulatory Visit
Admission: RE | Admit: 2015-09-30 | Discharge: 2015-09-30 | Disposition: A | Discharge status: Home / Self Care | Payer: Medicare HMO | Source: Ambulatory Visit

## 2015-09-30 DIAGNOSIS — Z1231 Encounter for screening mammogram for malignant neoplasm of breast: Secondary | ICD-10-CM

## 2016-08-25 ENCOUNTER — Other Ambulatory Visit: Payer: Self-pay | Admitting: Internal Medicine

## 2016-08-25 DIAGNOSIS — Z1231 Encounter for screening mammogram for malignant neoplasm of breast: Secondary | ICD-10-CM

## 2016-10-02 ENCOUNTER — Ambulatory Visit: Payer: Medicare HMO

## 2016-10-24 ENCOUNTER — Ambulatory Visit
Admission: RE | Admit: 2016-10-24 | Discharge: 2016-10-24 | Disposition: A | Discharge status: Home / Self Care | Payer: Medicare HMO | Source: Ambulatory Visit | Attending: Internal Medicine | Admitting: Internal Medicine

## 2016-10-24 ENCOUNTER — Ambulatory Visit: Payer: Medicare HMO

## 2016-10-24 DIAGNOSIS — Z1231 Encounter for screening mammogram for malignant neoplasm of breast: Secondary | ICD-10-CM

## 2017-03-27 ENCOUNTER — Ambulatory Visit
Admission: RE | Admit: 2017-03-27 | Discharge: 2017-03-27 | Disposition: A | Discharge status: Home / Self Care | Payer: Medicare HMO | Source: Ambulatory Visit | Attending: Internal Medicine | Admitting: Internal Medicine

## 2017-03-27 ENCOUNTER — Other Ambulatory Visit: Payer: Self-pay | Admitting: Internal Medicine

## 2017-03-27 DIAGNOSIS — J449 Chronic obstructive pulmonary disease, unspecified: Secondary | ICD-10-CM

## 2017-03-27 DIAGNOSIS — R059 Cough, unspecified: Secondary | ICD-10-CM

## 2017-03-27 DIAGNOSIS — R05 Cough: Secondary | ICD-10-CM

## 2017-09-17 ENCOUNTER — Other Ambulatory Visit: Payer: Self-pay | Admitting: Internal Medicine

## 2017-09-17 DIAGNOSIS — Z1231 Encounter for screening mammogram for malignant neoplasm of breast: Secondary | ICD-10-CM

## 2017-10-04 ENCOUNTER — Other Ambulatory Visit: Payer: Self-pay | Admitting: Internal Medicine

## 2017-10-04 DIAGNOSIS — R52 Pain, unspecified: Secondary | ICD-10-CM

## 2017-10-08 ENCOUNTER — Ambulatory Visit
Admission: RE | Admit: 2017-10-08 | Discharge: 2017-10-08 | Disposition: A | Discharge status: Home / Self Care | Payer: Medicare HMO | Source: Ambulatory Visit | Attending: Internal Medicine | Admitting: Internal Medicine

## 2017-10-08 DIAGNOSIS — R52 Pain, unspecified: Secondary | ICD-10-CM

## 2017-10-26 ENCOUNTER — Ambulatory Visit
Admission: RE | Admit: 2017-10-26 | Discharge: 2017-10-26 | Disposition: A | Discharge status: Home / Self Care | Payer: Medicare HMO | Source: Ambulatory Visit | Attending: Internal Medicine | Admitting: Internal Medicine

## 2017-10-26 DIAGNOSIS — Z1231 Encounter for screening mammogram for malignant neoplasm of breast: Secondary | ICD-10-CM

## 2018-04-02 IMAGING — CR DG CHEST 2V
2 series · 2 of 2 positions shown · non-contrast
Comparison: PA and lateral chest x-ray dated June 13, 2012

CLINICAL DATA: Left chest wall pain for the past day following yard
were. Tender to palpation.

EXAM:
CHEST  2 VIEW

[w chest pa]
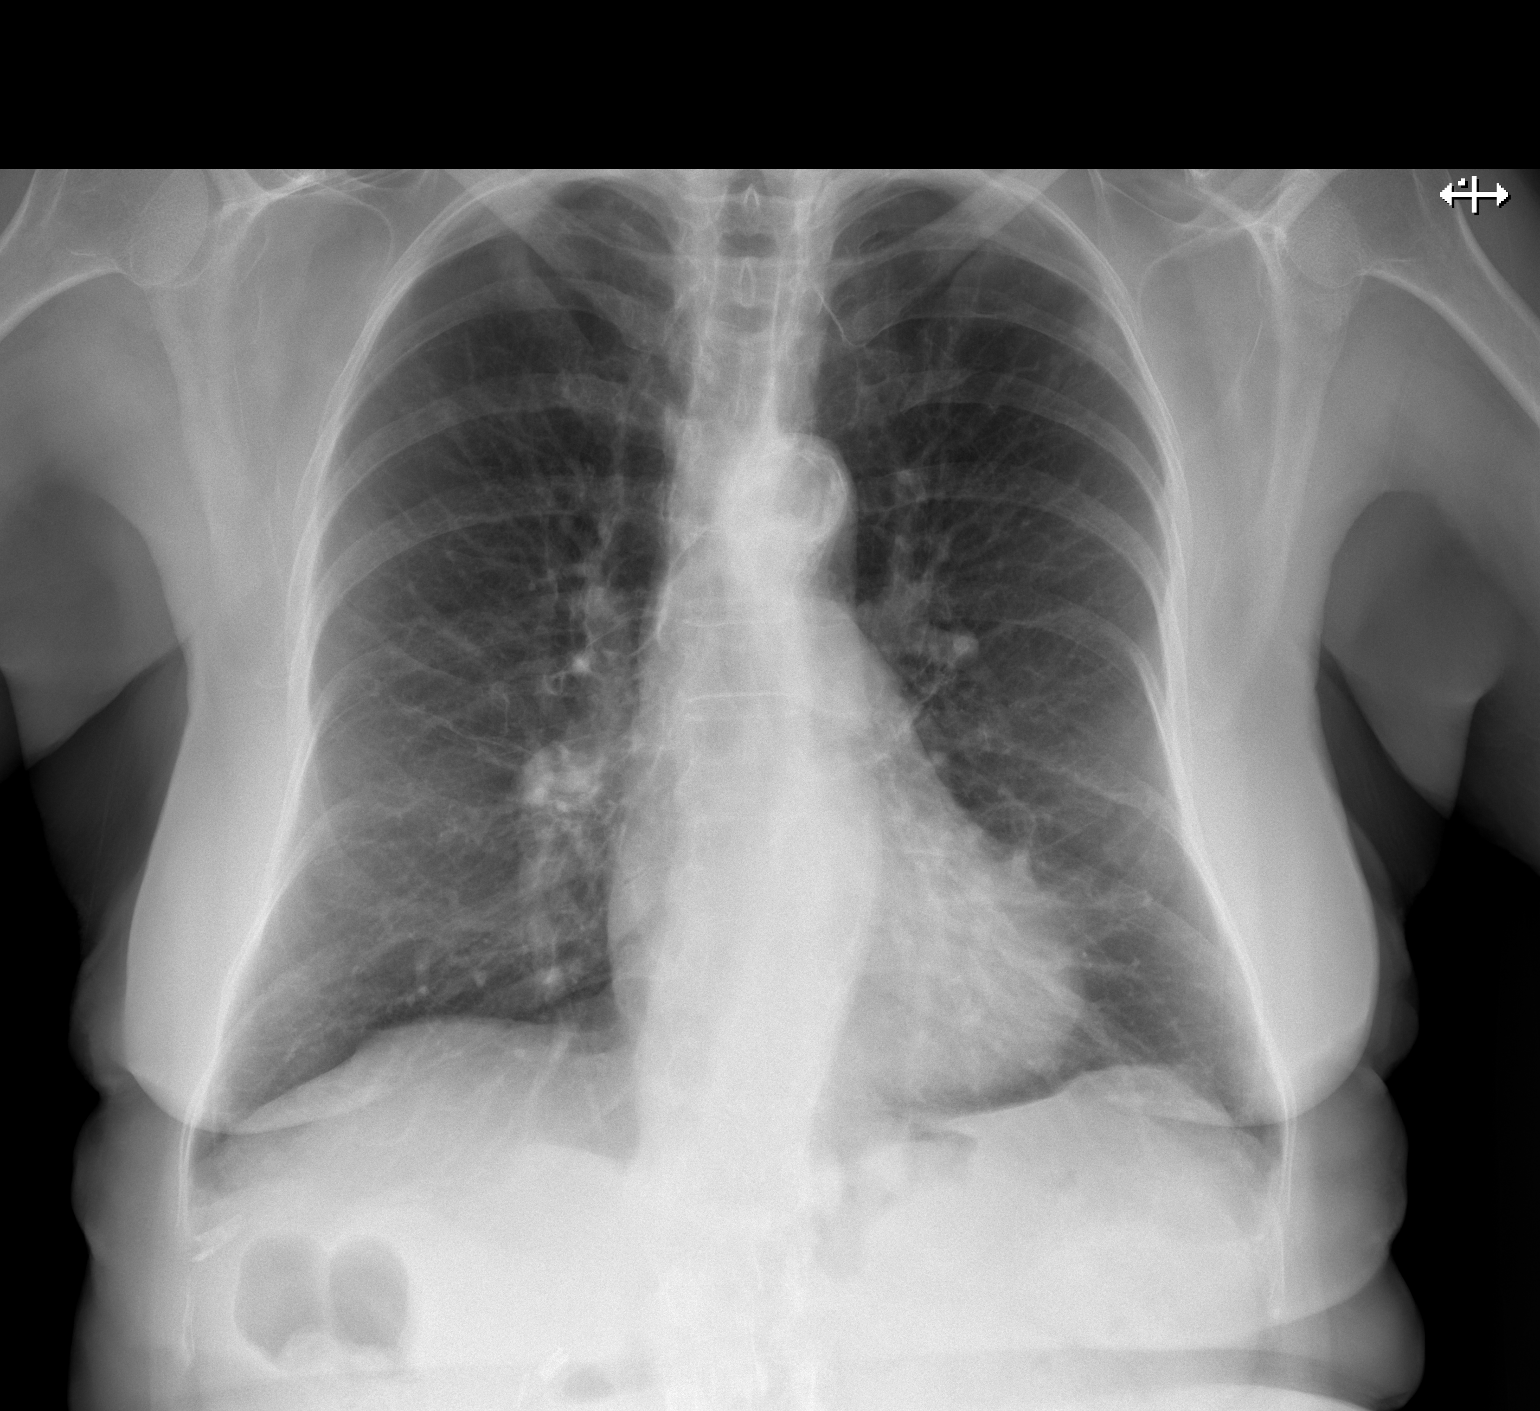

[w chest lat]
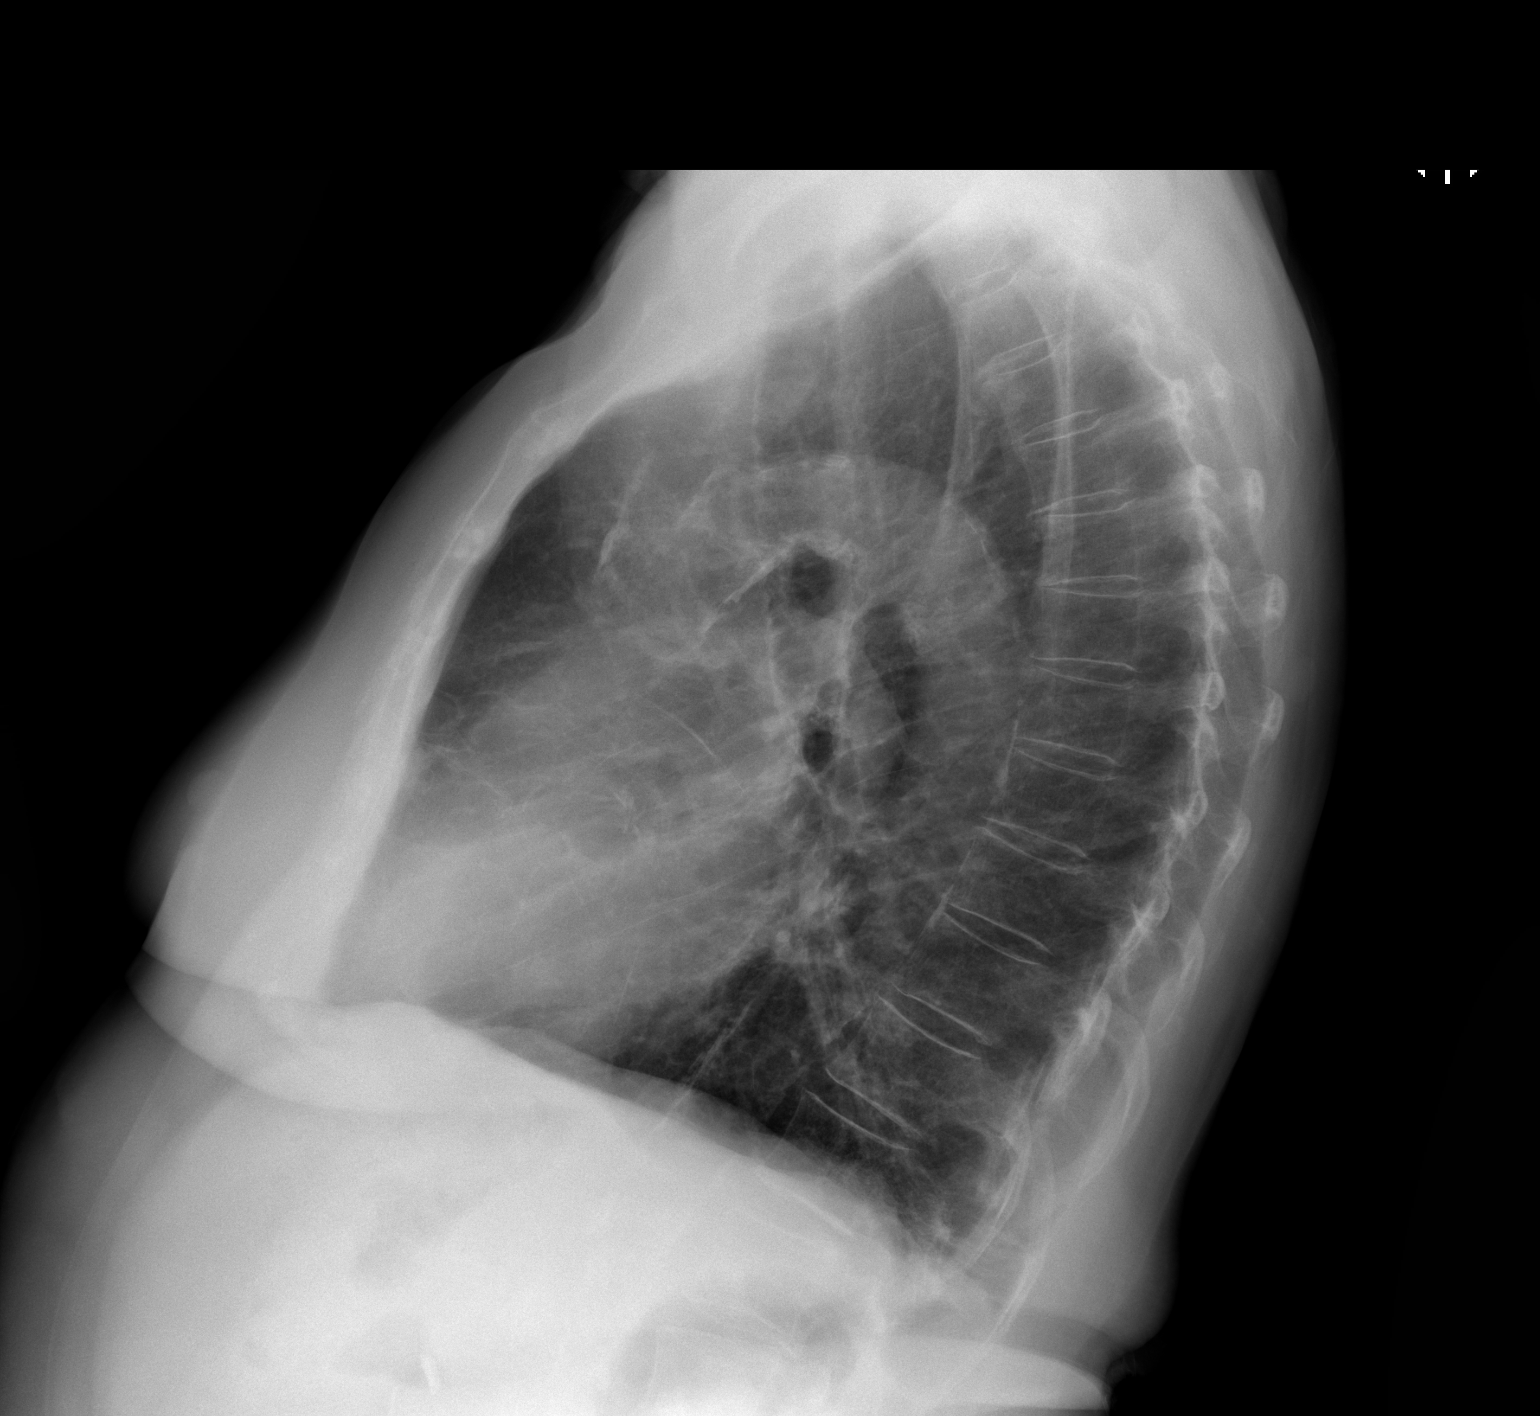

[2 of 2 positions shown; findings below may reference images not displayed]

FINDINGS: The lungs are well-expanded with mild hemidiaphragm flattening.
There is no focal infiltrate. There is no pleural effusion. The
heart and pulmonary vascularity are normal. There is calcification
in the wall of the thoracic aorta. The bony thorax is unremarkable.
IMPRESSION: Chronic bronchitic changes, stable. No pneumonia nor other acute
cardiopulmonary abnormality.

Thoracic aortic atherosclerosis.

## 2018-09-20 ENCOUNTER — Other Ambulatory Visit: Payer: Self-pay | Admitting: Internal Medicine

## 2018-09-20 DIAGNOSIS — Z1231 Encounter for screening mammogram for malignant neoplasm of breast: Secondary | ICD-10-CM

## 2018-10-29 ENCOUNTER — Ambulatory Visit: Payer: Medicare HMO

## 2018-12-11 ENCOUNTER — Other Ambulatory Visit: Payer: Self-pay

## 2018-12-11 ENCOUNTER — Ambulatory Visit
Admission: RE | Admit: 2018-12-11 | Discharge: 2018-12-11 | Disposition: A | Discharge status: Home / Self Care | Payer: Medicare HMO | Source: Ambulatory Visit | Attending: Internal Medicine | Admitting: Internal Medicine

## 2018-12-11 DIAGNOSIS — Z1231 Encounter for screening mammogram for malignant neoplasm of breast: Secondary | ICD-10-CM

## 2018-12-17 ENCOUNTER — Ambulatory Visit: Payer: Medicare HMO

## 2019-08-13 ENCOUNTER — Ambulatory Visit: Payer: Medicare Other | Attending: Internal Medicine

## 2019-08-13 DIAGNOSIS — Z23 Encounter for immunization: Secondary | ICD-10-CM | POA: Insufficient documentation

## 2019-08-13 NOTE — Progress Notes (Signed)
   Covid-19 Vaccination Clinic  Name:  LOMA DUBUQUE    MRN: [id number] DOB: [birth year]  08/13/2019  Ms. Stoffer was observed post Covid-19 immunization for 15 minutes without incidence. She was provided with Vaccine Information Sheet and instruction to access the V-Safe system.   Ms. Ocon was instructed to call 911 with any severe reactions post vaccine: Marland Kitchen Difficulty breathing  . Swelling of your face and throat  . A fast heartbeat  . A bad rash all over your body  . Dizziness and weakness    Immunizations Administered    Name Date Dose VIS Date Route   Pfizer COVID-19 Vaccine 08/13/2019  5:32 PM 0.3 mL 07/04/2019 Intramuscular   Manufacturer: [employer], [employer]   Lot: [id number]   NDC: [address]-3

## 2019-08-29 ENCOUNTER — Ambulatory Visit: Payer: Medicare HMO

## 2019-09-03 ENCOUNTER — Ambulatory Visit: Payer: Medicare HMO | Attending: Internal Medicine

## 2019-09-03 DIAGNOSIS — Z23 Encounter for immunization: Secondary | ICD-10-CM

## 2019-09-03 NOTE — Progress Notes (Signed)
   Covid-19 Vaccination Clinic  Name:  APRILE DICKENSON    MRN: [id number] DOB: Nov 02, 1926  09/03/2019  Ms. Breit was observed post Covid-19 immunization for 15 minutes without incidence. She was provided with Vaccine Information Sheet and instruction to access the V-Safe system.   Ms. Wahba was instructed to call 911 with any severe reactions post vaccine: Marland Kitchen Difficulty breathing  . Swelling of your face and throat  . A fast heartbeat  . A bad rash all over your body  . Dizziness and weakness    Immunizations Administered    Name Date Dose VIS Date Route   Pfizer COVID-19 Vaccine 09/03/2019 12:48 PM 0.3 mL 07/04/2019 Intramuscular   Manufacturer: [employer], [employer]   Lot: [id number]   NDC: [address]-1

## 2019-11-07 ENCOUNTER — Other Ambulatory Visit: Payer: Self-pay | Admitting: Internal Medicine

## 2019-11-07 DIAGNOSIS — Z1231 Encounter for screening mammogram for malignant neoplasm of breast: Secondary | ICD-10-CM

## 2019-11-11 ENCOUNTER — Encounter (INDEPENDENT_AMBULATORY_CARE_PROVIDER_SITE_OTHER): Payer: Medicare HMO | Admitting: Ophthalmology

## 2019-12-12 ENCOUNTER — Ambulatory Visit
Admission: RE | Admit: 2019-12-12 | Discharge: 2019-12-12 | Disposition: A | Discharge status: Home / Self Care | Payer: Medicare HMO | Source: Ambulatory Visit | Attending: Internal Medicine | Admitting: Internal Medicine

## 2019-12-12 ENCOUNTER — Other Ambulatory Visit: Payer: Self-pay

## 2019-12-12 DIAGNOSIS — Z1231 Encounter for screening mammogram for malignant neoplasm of breast: Secondary | ICD-10-CM

## 2020-04-07 ENCOUNTER — Other Ambulatory Visit: Payer: Self-pay | Admitting: Internal Medicine

## 2020-04-07 DIAGNOSIS — M79604 Pain in right leg: Secondary | ICD-10-CM

## 2020-04-12 ENCOUNTER — Other Ambulatory Visit: Payer: Medicare HMO

## 2020-04-13 ENCOUNTER — Ambulatory Visit
Admission: RE | Admit: 2020-04-13 | Discharge: 2020-04-13 | Disposition: A | Discharge status: Home / Self Care | Payer: Medicare HMO | Source: Ambulatory Visit | Attending: Internal Medicine | Admitting: Internal Medicine

## 2020-04-13 DIAGNOSIS — M79604 Pain in right leg: Secondary | ICD-10-CM

## 2021-01-28 ENCOUNTER — Other Ambulatory Visit: Payer: Self-pay | Admitting: Internal Medicine

## 2021-01-28 ENCOUNTER — Other Ambulatory Visit: Payer: Self-pay

## 2021-01-28 ENCOUNTER — Ambulatory Visit
Admission: RE | Admit: 2021-01-28 | Discharge: 2021-01-28 | Disposition: A | Discharge status: Home / Self Care | Payer: Medicare HMO | Source: Ambulatory Visit | Attending: Internal Medicine | Admitting: Internal Medicine

## 2021-01-28 DIAGNOSIS — M545 Low back pain, unspecified: Secondary | ICD-10-CM

## 2021-01-28 DIAGNOSIS — M25551 Pain in right hip: Secondary | ICD-10-CM

## 2021-02-08 ENCOUNTER — Other Ambulatory Visit: Payer: Self-pay | Admitting: Internal Medicine

## 2021-02-08 DIAGNOSIS — M8580 Other specified disorders of bone density and structure, unspecified site: Secondary | ICD-10-CM

## 2021-04-26 ENCOUNTER — Other Ambulatory Visit: Payer: Self-pay

## 2021-04-26 ENCOUNTER — Ambulatory Visit
Admission: RE | Admit: 2021-04-26 | Discharge: 2021-04-26 | Disposition: A | Discharge status: Home / Self Care | Payer: Medicare HMO | Source: Ambulatory Visit | Attending: Internal Medicine | Admitting: Internal Medicine

## 2021-04-26 DIAGNOSIS — M8580 Other specified disorders of bone density and structure, unspecified site: Secondary | ICD-10-CM

## 2023-01-02 ENCOUNTER — Emergency Department (HOSPITAL_COMMUNITY): Payer: Medicare HMO

## 2023-01-02 ENCOUNTER — Inpatient Hospital Stay (HOSPITAL_COMMUNITY): Payer: Medicare HMO

## 2023-01-02 ENCOUNTER — Other Ambulatory Visit: Payer: Self-pay

## 2023-01-02 ENCOUNTER — Inpatient Hospital Stay (HOSPITAL_COMMUNITY)
Admission: EM | Admit: 2023-01-02 | Discharge: 2023-01-06 | DRG: 871 | Disposition: A | Discharge status: Home Health | Payer: Medicare HMO | Attending: Internal Medicine | Admitting: Internal Medicine

## 2023-01-02 DIAGNOSIS — Z1152 Encounter for screening for COVID-19: Secondary | ICD-10-CM

## 2023-01-02 DIAGNOSIS — Z515 Encounter for palliative care: Secondary | ICD-10-CM | POA: Diagnosis not present

## 2023-01-02 DIAGNOSIS — Z803 Family history of malignant neoplasm of breast: Secondary | ICD-10-CM

## 2023-01-02 DIAGNOSIS — Z87891 Personal history of nicotine dependence: Secondary | ICD-10-CM | POA: Diagnosis not present

## 2023-01-02 DIAGNOSIS — R748 Abnormal levels of other serum enzymes: Secondary | ICD-10-CM | POA: Diagnosis present

## 2023-01-02 DIAGNOSIS — Z8249 Family history of ischemic heart disease and other diseases of the circulatory system: Secondary | ICD-10-CM | POA: Diagnosis not present

## 2023-01-02 DIAGNOSIS — J449 Chronic obstructive pulmonary disease, unspecified: Secondary | ICD-10-CM | POA: Diagnosis not present

## 2023-01-02 DIAGNOSIS — Z885 Allergy status to narcotic agent status: Secondary | ICD-10-CM

## 2023-01-02 DIAGNOSIS — B9620 Unspecified Escherichia coli [E. coli] as the cause of diseases classified elsewhere: Secondary | ICD-10-CM | POA: Diagnosis not present

## 2023-01-02 DIAGNOSIS — J189 Pneumonia, unspecified organism: Secondary | ICD-10-CM

## 2023-01-02 DIAGNOSIS — I10 Essential (primary) hypertension: Secondary | ICD-10-CM | POA: Diagnosis present

## 2023-01-02 DIAGNOSIS — R7881 Bacteremia: Secondary | ICD-10-CM | POA: Diagnosis not present

## 2023-01-02 DIAGNOSIS — J9601 Acute respiratory failure with hypoxia: Secondary | ICD-10-CM

## 2023-01-02 DIAGNOSIS — Z825 Family history of asthma and other chronic lower respiratory diseases: Secondary | ICD-10-CM

## 2023-01-02 DIAGNOSIS — Z79890 Hormone replacement therapy (postmenopausal): Secondary | ICD-10-CM | POA: Diagnosis not present

## 2023-01-02 DIAGNOSIS — E119 Type 2 diabetes mellitus without complications: Secondary | ICD-10-CM | POA: Diagnosis present

## 2023-01-02 DIAGNOSIS — J439 Emphysema, unspecified: Secondary | ICD-10-CM | POA: Diagnosis present

## 2023-01-02 DIAGNOSIS — J440 Chronic obstructive pulmonary disease with acute lower respiratory infection: Secondary | ICD-10-CM | POA: Diagnosis present

## 2023-01-02 DIAGNOSIS — E861 Hypovolemia: Secondary | ICD-10-CM | POA: Diagnosis present

## 2023-01-02 DIAGNOSIS — R0902 Hypoxemia: Secondary | ICD-10-CM

## 2023-01-02 DIAGNOSIS — Z79899 Other long term (current) drug therapy: Secondary | ICD-10-CM

## 2023-01-02 DIAGNOSIS — Z66 Do not resuscitate: Secondary | ICD-10-CM | POA: Diagnosis present

## 2023-01-02 DIAGNOSIS — E860 Dehydration: Secondary | ICD-10-CM | POA: Diagnosis present

## 2023-01-02 DIAGNOSIS — M810 Age-related osteoporosis without current pathological fracture: Secondary | ICD-10-CM | POA: Diagnosis present

## 2023-01-02 DIAGNOSIS — R6520 Severe sepsis without septic shock: Secondary | ICD-10-CM

## 2023-01-02 DIAGNOSIS — E039 Hypothyroidism, unspecified: Secondary | ICD-10-CM | POA: Diagnosis present

## 2023-01-02 DIAGNOSIS — A419 Sepsis, unspecified organism: Secondary | ICD-10-CM

## 2023-01-02 DIAGNOSIS — M6282 Rhabdomyolysis: Secondary | ICD-10-CM | POA: Diagnosis present

## 2023-01-02 DIAGNOSIS — E871 Hypo-osmolality and hyponatremia: Secondary | ICD-10-CM | POA: Diagnosis present

## 2023-01-02 DIAGNOSIS — R410 Disorientation, unspecified: Secondary | ICD-10-CM | POA: Diagnosis present

## 2023-01-02 DIAGNOSIS — N179 Acute kidney failure, unspecified: Secondary | ICD-10-CM | POA: Diagnosis present

## 2023-01-02 DIAGNOSIS — J441 Chronic obstructive pulmonary disease with (acute) exacerbation: Secondary | ICD-10-CM | POA: Diagnosis not present

## 2023-01-02 DIAGNOSIS — R7989 Other specified abnormal findings of blood chemistry: Secondary | ICD-10-CM

## 2023-01-02 DIAGNOSIS — Z91048 Other nonmedicinal substance allergy status: Secondary | ICD-10-CM

## 2023-01-02 DIAGNOSIS — Z9049 Acquired absence of other specified parts of digestive tract: Secondary | ICD-10-CM

## 2023-01-02 DIAGNOSIS — Z7982 Long term (current) use of aspirin: Secondary | ICD-10-CM

## 2023-01-02 DIAGNOSIS — Z90710 Acquired absence of both cervix and uterus: Secondary | ICD-10-CM

## 2023-01-02 DIAGNOSIS — R0609 Other forms of dyspnea: Secondary | ICD-10-CM | POA: Diagnosis not present

## 2023-01-02 DIAGNOSIS — A4151 Sepsis due to Escherichia coli [E. coli]: Principal | ICD-10-CM | POA: Diagnosis present

## 2023-01-02 DIAGNOSIS — E876 Hypokalemia: Secondary | ICD-10-CM | POA: Diagnosis present

## 2023-01-02 DIAGNOSIS — R6510 Systemic inflammatory response syndrome (SIRS) of non-infectious origin without acute organ dysfunction: Secondary | ICD-10-CM

## 2023-01-02 DIAGNOSIS — N390 Urinary tract infection, site not specified: Secondary | ICD-10-CM | POA: Diagnosis present

## 2023-01-02 LAB — CBC WITH DIFFERENTIAL/PLATELET
Abs Immature Granulocytes: 0.39 10*3/uL — ABNORMAL HIGH (ref 0.00–0.07)
Basophils Absolute: 0 10*3/uL (ref 0.0–0.1)
Basophils Relative: 0 %
Eosinophils Absolute: 0 10*3/uL (ref 0.0–0.5)
Eosinophils Relative: 0 %
HCT: 38.7 % (ref 36.0–46.0)
Hemoglobin: 13.3 g/dL (ref 12.0–15.0)
Immature Granulocytes: 2 %
Lymphocytes Relative: 4 %
Lymphs Abs: 0.7 10*3/uL (ref 0.7–4.0)
MCH: 31.6 pg (ref 26.0–34.0)
MCHC: 34.4 g/dL (ref 30.0–36.0)
MCV: 91.9 fL (ref 80.0–100.0)
Monocytes Absolute: 0.8 10*3/uL (ref 0.1–1.0)
Monocytes Relative: 5 %
Neutro Abs: 15.6 10*3/uL — ABNORMAL HIGH (ref 1.7–7.7)
Neutrophils Relative %: 89 %
Platelets: 172 10*3/uL (ref 150–400)
RBC: 4.21 MIL/uL (ref 3.87–5.11)
RDW: 13 % (ref 11.5–15.5)
WBC: 17.5 10*3/uL — ABNORMAL HIGH (ref 4.0–10.5)
nRBC: 0 % (ref 0.0–0.2)

## 2023-01-02 LAB — COMPREHENSIVE METABOLIC PANEL
ALT: 26 U/L (ref 0–44)
AST: 51 U/L — ABNORMAL HIGH (ref 15–41)
Albumin: 3.3 g/dL — ABNORMAL LOW (ref 3.5–5.0)
Alkaline Phosphatase: 51 U/L (ref 38–126)
Anion gap: 15 (ref 5–15)
BUN: 41 mg/dL — ABNORMAL HIGH (ref 8–23)
CO2: 24 mmol/L (ref 22–32)
Calcium: 9.3 mg/dL (ref 8.9–10.3)
Chloride: 92 mmol/L — ABNORMAL LOW (ref 98–111)
Creatinine, Ser: 2.32 mg/dL — ABNORMAL HIGH (ref 0.44–1.00)
GFR, Estimated: 19 mL/min — ABNORMAL LOW (ref >60)
Glucose, Bld: 139 mg/dL — ABNORMAL HIGH (ref 70–99)
Potassium: 3.6 mmol/L (ref 3.5–5.1)
Sodium: 131 mmol/L — ABNORMAL LOW (ref 135–145)
Total Bilirubin: 0.5 mg/dL (ref 0.3–1.2)
Total Protein: 7 g/dL (ref 6.5–8.1)

## 2023-01-02 LAB — URINALYSIS, W/ REFLEX TO CULTURE (INFECTION SUSPECTED)
Bilirubin Urine: NEGATIVE
Glucose, UA: NEGATIVE mg/dL
Ketones, ur: NEGATIVE mg/dL
Nitrite: NEGATIVE
Protein, ur: >=300 mg/dL — AB
Specific Gravity, Urine: 1.015 (ref 1.005–1.030)
WBC, UA: >50 WBC/hpf (ref 0–5)
pH: 5 (ref 5.0–8.0)

## 2023-01-02 LAB — PHOSPHORUS: Phosphorus: 4.2 mg/dL (ref 2.5–4.6)

## 2023-01-02 LAB — BLOOD GAS, VENOUS
Acid-base deficit: 0.6 mmol/L (ref 0.0–2.0)
Bicarbonate: 27.6 mmol/L (ref 20.0–28.0)
O2 Saturation: 46.7 %
Patient temperature: 37
pCO2, Ven: 60 mmHg (ref 44–60)
pH, Ven: 7.27 (ref 7.25–7.43)
pO2, Ven: 31 mmHg — CL (ref 32–45)

## 2023-01-02 LAB — PROTIME-INR
INR: 1.3 — ABNORMAL HIGH (ref 0.8–1.2)
Prothrombin Time: 16.6 seconds — ABNORMAL HIGH (ref 11.4–15.2)

## 2023-01-02 LAB — RESP PANEL BY RT-PCR (RSV, FLU A&B, COVID)  RVPGX2
Influenza A by PCR: NEGATIVE
Influenza B by PCR: NEGATIVE
Resp Syncytial Virus by PCR: NEGATIVE
SARS Coronavirus 2 by RT PCR: NEGATIVE

## 2023-01-02 LAB — TROPONIN I (HIGH SENSITIVITY): Troponin I (High Sensitivity): 24 ng/L — ABNORMAL HIGH (ref <18)

## 2023-01-02 LAB — AMMONIA: Ammonia: 19 umol/L (ref 9–35)

## 2023-01-02 LAB — LACTIC ACID, PLASMA
Lactic Acid, Venous: 2.9 mmol/L (ref 0.5–1.9)
Lactic Acid, Venous: 3.9 mmol/L (ref 0.5–1.9)

## 2023-01-02 LAB — TSH: TSH: 18.142 u[IU]/mL — ABNORMAL HIGH (ref 0.350–4.500)

## 2023-01-02 LAB — CREATININE, URINE, RANDOM: Creatinine, Urine: 159 mg/dL

## 2023-01-02 LAB — SODIUM, URINE, RANDOM: Sodium, Ur: 26 mmol/L

## 2023-01-02 LAB — MAGNESIUM: Magnesium: 1.5 mg/dL — ABNORMAL LOW (ref 1.7–2.4)

## 2023-01-02 LAB — APTT: aPTT: 44 seconds — ABNORMAL HIGH (ref 24–36)

## 2023-01-02 LAB — PROCALCITONIN: Procalcitonin: 21.33 ng/mL

## 2023-01-02 MED ORDER — IPRATROPIUM-ALBUTEROL 0.5-2.5 (3) MG/3ML IN SOLN
3.0000 mL | Freq: Four times a day (QID) | RESPIRATORY_TRACT | Status: DC
  Administered 2023-01-02 – 2023-01-04 (×6): 3 mL via RESPIRATORY_TRACT
  Filled 2023-01-02 (×7): qty 3

## 2023-01-02 MED ORDER — SODIUM CHLORIDE 0.9 % IV SOLN: 500.0000 mg | INTRAVENOUS | Status: DC

## 2023-01-02 MED ORDER — LACTATED RINGERS IV BOLUS (SEPSIS)
1000.0000 mL | Freq: Once | INTRAVENOUS | Status: AC
  Administered 2023-01-02: 1000 mL via INTRAVENOUS

## 2023-01-02 MED ORDER — SODIUM CHLORIDE 0.9 % IV SOLN: 2.0000 g | INTRAVENOUS | Status: DC

## 2023-01-02 MED ORDER — ALBUTEROL SULFATE (2.5 MG/3ML) 0.083% IN NEBU: 2.5000 mg | INHALATION_SOLUTION | RESPIRATORY_TRACT | Status: DC | PRN

## 2023-01-02 MED ORDER — SODIUM CHLORIDE 0.9 % IV SOLN
500.0000 mg | INTRAVENOUS | Status: DC
  Administered 2023-01-02: 500 mg via INTRAVENOUS
  Filled 2023-01-02: qty 5

## 2023-01-02 MED ORDER — SODIUM CHLORIDE 0.9 % IV SOLN
2.0000 g | INTRAVENOUS | Status: DC
  Administered 2023-01-02 – 2023-01-05 (×4): 2 g via INTRAVENOUS
  Filled 2023-01-02 (×4): qty 20

## 2023-01-02 MED ORDER — LACTATED RINGERS IV BOLUS (SEPSIS)
250.0000 mL | Freq: Once | INTRAVENOUS | Status: AC
  Administered 2023-01-02: 250 mL via INTRAVENOUS

## 2023-01-02 MED ORDER — LACTATED RINGERS IV SOLN: INTRAVENOUS | Status: DC

## 2023-01-02 NOTE — H&P (Signed)
Diamond Holland:[id number] DOB: Feb 21, 1927 DOA: 01/02/2023     PCP: Ralene Ok, MD   Outpatient Specialists:   CARDS: [place]     Patient arrived to ER on 01/02/23 at 1917 Referred by Attending Gwyneth Sprout, MD   Patient coming from:    home Lives   With family    Chief Complaint:   Chief Complaint  Patient presents with   Emesis   Altered Mental Status    HPI: Diamond Holland is a 89 y.o. female with medical history significant of  HTN, COPD, DM2, hypothyrodism     Presented with  confusion and fever Comes in with confusion and generalized fatigue for the past 2 days EMS states was febrile on arrival Patient endorses nausea and vomiting Blood pressure 110/48 heart rate 120s Patient has known history of COPD and diabetes It seems that day and nausea came after patient took her medicines yesterday Satting 80% on room air on arrival At baseline not on oxygen No prior history of CHF No chest pains     Per family pt has not had increased work of breathing until recently  She would not want any aggressive measures  Ok to do IV fluids and antibiotics and try a BIPAP  If pt continues to deteriorate family would like to proceed to comfort Andbe notified  Denies significant ETOH intake   Does not smoke   No results found for: "SARSCOV2NAA"      Regarding pertinent Chronic problems:       HTN on Cozaar Dyazide      COPD - not  followed by pulmonology  not  on baseline oxygen       While in ER:  Found to have pneumonia and sepsis started on On IV fluids and Rocephin azithromycin Patient started to have increased work of breathing VBG ordered For increased work of breathing BiPAP was started Discussed this with family who at this point would like [place] to give it a try to see if patient will cooperate if patient does not cooperate and continues to deteriorate they would like her to be transition to comfort care so she does not suffer      Lab  Orders         Culture, blood (Routine x 2)         Resp panel by RT-PCR (RSV, Flu A&B, Covid) Anterior Nasal Swab         Comprehensive metabolic panel         Lactic acid, plasma         CBC with Differential         Protime-INR         Urinalysis, w/ Reflex to Culture (Infection Suspected) -Urine, Clean Catch      CT HEAD ordered    CXR -  Ill-defined opacity at the right lung base peripherally may represent airspace disease, effusion or combination of thereof. 2. Question of left upper lobe nodular density. Chest CT is recommended for characterization. 3. Peribronchial thickening appears chronic.     Following Medications were ordered in ER: Medications  lactated ringers infusion ( Intravenous New Bag/Given 01/02/23 2025)  lactated ringers bolus 1,000 mL (1,000 mLs Intravenous New Bag/Given 01/02/23 2018)    And  lactated ringers bolus 1,000 mL (1,000 mLs Intravenous New Bag/Given 01/02/23 2025)    And  lactated ringers bolus 250 mL (has no administration in time range)  cefTRIAXone (ROCEPHIN) 2 g in sodium chloride 0.9 %  100 mL IVPB (0 g Intravenous Stopped 01/02/23 2047)  azithromycin (ZITHROMAX) 500 mg in sodium chloride 0.9 % 250 mL IVPB (0 mg Intravenous Stopped 01/02/23 2118)       ED Triage Vitals  Enc Vitals Group     BP 01/02/23 1949 95/68     Pulse Rate 01/02/23 1949 (!) 114     Resp 01/02/23 1949 (!) 31     Temp 01/02/23 1949 98.8 F (37.1 C)     Temp src --      SpO2 01/02/23 1949 99 %     Weight --      Height --      Head Circumference --      Peak Flow --      Pain Score 01/02/23 1925 0     Pain Loc --      Pain Edu? --      Excl. in GC? --   TMAX(24)@     _________________________________________ Significant initial  Findings: Abnormal Labs Reviewed  COMPREHENSIVE METABOLIC PANEL - Abnormal; Notable for the following components:      Result Value   Sodium 131 (*)    Chloride 92 (*)    Glucose, Bld 139 (*)    BUN 41 (*)    Creatinine, Ser 2.32  (*)    Albumin 3.3 (*)    AST 51 (*)    GFR, Estimated 19 (*)    All other components within normal limits  LACTIC ACID, PLASMA - Abnormal; Notable for the following components:   Lactic Acid, Venous 2.9 (*)    All other components within normal limits  CBC WITH DIFFERENTIAL/PLATELET - Abnormal; Notable for the following components:   WBC 17.5 (*)    Neutro Abs 15.6 (*)    Abs Immature Granulocytes 0.39 (*)    All other components within normal limits  PROTIME-INR - Abnormal; Notable for the following components:   Prothrombin Time 16.6 (*)    INR 1.3 (*)    All other components within normal limits      _________________________ Troponin  ordered     ECG: Ordered Personally reviewed and interpreted by me showing: HR : 115 Rhythm:Sinus tachycardia Multiple ventricular premature complexes Anterior infarct, old Borderline repolarization abnormality QTC 435   __________ This patient meets SIRS Criteria and may be septic.  The recent clinical data is shown below. Vitals:   01/02/23 1949 01/02/23 2129  BP: 95/68 118/65  Pulse: (!) 114 99  Resp: (!) 31 (!) 26  Temp: 98.8 F (37.1 C) 98.2 F (36.8 C)  SpO2: 99% 98%  WBC     Component Value Date/Time   WBC 17.5 (H) 01/02/2023 1949   LYMPHSABS 0.7 01/02/2023 1949   MONOABS 0.8 01/02/2023 1949   EOSABS 0.0 01/02/2023 1949   BASOSABS 0.0 01/02/2023 1949  Lactic Acid, Venous    Component Value Date/Time   LATICACIDVEN 2.9 (HH) 01/02/2023 1949   Procalcitonin   Ordered      UA   ordered    No results found for this or any previous visit.  ABX started Antibiotics Given (last 72 hours)     Date/Time Action Medication Dose Rate   01/02/23 2019 New Bag/Given   cefTRIAXone (ROCEPHIN) 2 g in sodium chloride 0.9 % 100 mL IVPB 2 g 200 mL/hr   01/02/23 2019 New Bag/Given   azithromycin (ZITHROMAX) 500 mg in sodium chloride 0.9 % 250 mL IVPB 500 mg 250 mL/hr     VBG ordered  __________________________________________________________ Recent Labs  Lab 01/02/23 1949  NA 131*  K 3.6  CO2 24  GLUCOSE 139*  BUN 41*  CREATININE 2.32*  CALCIUM 9.3    Cr    Up from baseline see below Lab Results  Component Value Date   CREATININE 2.32 (H) 01/02/2023    Recent Labs  Lab 01/02/23 1949  AST 51*  ALT 26  ALKPHOS 51  BILITOT 0.5  PROT 7.0  ALBUMIN 3.3*   Lab Results  Component Value Date   CALCIUM 9.3 01/02/2023       Plt: Lab Results  Component Value Date   PLT 172 01/02/2023       Recent Labs  Lab 01/02/23 1949  WBC 17.5*  NEUTROABS 15.6*  HGB 13.3  HCT 38.7  MCV 91.9  PLT 172    HG/HCT   stable,       Component Value Date/Time   HGB 13.3 01/02/2023 1949   HCT 38.7 01/02/2023 1949   MCV 91.9 01/02/2023 1949   MCV 87.3 05/31/2015 1213     _______________________________________________ Hospitalist was called for admission for CAP, SEPSIS AKI  Elevated lactic acid level   Hypoxia   The following Work up has been ordered so far:  Orders Placed This Encounter  Procedures   Culture, blood (Routine x 2)   Resp panel by RT-PCR (RSV, Flu A&B, Covid) Anterior Nasal Swab   DG Chest Port 1 View   Comprehensive metabolic panel   Lactic acid, plasma   CBC with Differential   Protime-INR   Urinalysis, w/ Reflex to Culture (Infection Suspected) -Urine, Clean Catch   Diet NPO time specified   Notify physician (specify)  Specify: Notify provider for possible Code Sepsis   Document height and weight   Document height and weight   Assess and Document Glasgow Coma Scale   Document vital signs within 1-hour of fluid bolus completion. Notify provider of abnormal vital signs despite fluid resuscitation.   DO NOT delay antibiotics if unable to obtain blood culture.   Refer to Sidebar Report: Sepsis Sidebar ED/IP   Notify provider for difficulties obtaining IV access.   Insert peripheral IV x 2   Initiate Carrier Fluid Protocol   Code  Sepsis activation.  This occurs automatically when order is signed and prioritizes pharmacy, lab, and radiology services for STAT collections and interventions.  If [facility] downtime, call [facility] [phone]) to activate Code Sepsis.   Consult to hospitalist   EKG 12-Lead   ED EKG 12-Lead     OTHER Significant initial  Findings:  labs showing:     DM  labs:  HbA1C: No results for input(s): "HGBA1C" in the last 8760 hours.     CBG (last 3)  No results for input(s): "GLUCAP" in the last 72 hours.        Cultures: No results found for: "SDES", "SPECREQUEST", "CULT", "REPTSTATUS"   Radiological Exams on Admission: DG Chest Port 1 View  Result Date: 01/02/2023 CLINICAL DATA:  Sepsis. EXAM: PORTABLE CHEST 1 VIEW COMPARISON:  Radiograph 03/27/2017 FINDINGS: Heart is normal in size. Aortic tortuosity and atherosclerosis. Ill-defined opacity at the right lung base peripherally may represent airspace disease, effusion or combination there of. Question of left upper lobe nodular density. Peribronchial thickening appears chronic. No pneumothorax. IMPRESSION: 1. Ill-defined opacity at the right lung base peripherally may represent airspace disease, effusion or combination of thereof. 2. Question of left upper lobe nodular density. Chest CT is recommended for characterization. 3. Peribronchial thickening appears  chronic. Electronically Signed   By: Narda Rutherford M.D.   On: 01/02/2023 20:41   _______________________________________________________________________________________________________ Latest  Blood pressure 118/65, pulse 99, temperature 98.2 F (36.8 C), resp. rate (!) 26, SpO2 98 %.   Vitals  labs and radiology finding personally reviewed  Review of Systems:    Pertinent positives include:  shortness of breath at rest.    Fevers, chills, fatigue,abdominal pain, nausea, vomiting, Constitutional:  No weight loss, night sweats, weight loss  HEENT:  No headaches, Difficulty  swallowing,Tooth/dental problems,Sore throat,  No sneezing, itching, ear ache, nasal congestion, post nasal drip,  Cardio-vascular:  No chest pain, Orthopnea, PND, anasarca, dizziness, palpitations.no Bilateral lower extremity swelling  GI:  No heartburn, indigestion,  diarrhea, change in bowel habits, loss of appetite, melena, blood in stool, hematemesis Resp:  no  No excess mucus, no productive cough, No non-productive cough, No coughing up of blood.No change in color of mucus.No wheezing. Skin:  no rash or lesions. No jaundice GU:  no dysuria, change in color of urine, no urgency or frequency. No straining to urinate.  No flank pain.  Musculoskeletal:  No joint pain or no joint swelling. No decreased range of motion. No back pain.  Psych:  No change in mood or affect. No depression or anxiety. No memory loss.  Neuro: no localizing neurological complaints, no tingling, no weakness, no double vision, no gait abnormality, no slurred speech, no confusion  All systems reviewed and apart from HOPI all are negative _______________________________________________________________________________________________ Past Medical History:   Past Medical History:  Diagnosis Date   Allergy    Arthritis    Asthma    COPD (chronic obstructive pulmonary disease) (HCC)    Diabetes mellitus without complication (HCC)    Emphysema of lung (HCC)    Osteoporosis    Thyroid disease       Past Surgical History:  Procedure Laterality Date   ABDOMINAL HYSTERECTOMY     BREAST BIOPSY Right pt unsure   benign   CHOLECYSTECTOMY      Social History:  Ambulatory         reports that she has quit smoking. She does not have any smokeless tobacco history on file. No history on file for alcohol use and drug use.   Family History:   Family History  Problem Relation Age of Onset   Cancer Mother    Heart disease Mother    Cancer Father    Heart disease Father    Cancer Brother    Breast cancer  Daughter    Breast cancer Maternal Grandmother    ______________________________________________________________________________________________ Allergies: Allergies  Allergen Reactions   Tape Other (See Comments)    SKIN IS VERY DELICATE AND WILL TEAR AND BRUISE EASILY!!!!   Codeine Other (See Comments)    Stomach pain     Prior to Admission medications   Medication Sig Start Date End Date Taking? Authorizing Provider  aspirin EC 81 MG tablet Take 81 mg by mouth daily. Swallow whole.   Yes [provider]  Calcium Citrate-Vitamin D (CALCIUM CITRATE + D PO) Take 1 tablet by mouth daily.   Yes [provider]  diphenhydrAMINE (BENADRYL) 25 mg capsule Take 25 mg by mouth every 6 (six) hours as needed for itching, allergies or sleep.   Yes [provider]  Glucosamine-Chondroitin-MSM 375-300-237.5 MG CAPS Take 1 capsule by mouth daily.   Yes [provider]  LIVALO 2 MG TABS Take 2 mg by mouth in the morning.  Yes [provider]  losartan (COZAAR) 50 MG tablet Take 50 mg by mouth at bedtime.   Yes [provider]  Multiple Vitamins-Minerals (MULTI FOR HER 50+) TABS Take 1 tablet by mouth daily with breakfast.   Yes [provider]  Omega-3 Fatty Acids (OMEGA 3 500 PO) Take 500 mg by mouth daily.   Yes [provider]  pantoprazole (PROTONIX) 20 MG tablet Take 20 mg by mouth daily before breakfast.   Yes [provider]  Phenylephrine-DM-GG-APAP (SEVERE COLD & FLU PO) Take 1 capsule by mouth every 4 (four) hours as needed (for COPD symptoms).   Yes [provider]  SYNTHROID 100 MCG tablet Take 100 mcg by mouth daily before breakfast.   Yes [provider]  triamterene-hydrochlorothiazide (DYAZIDE) 37.5-25 MG per capsule Take 1 capsule by mouth in the morning.   Yes [provider]  albuterol (PROVENTIL HFA;VENTOLIN HFA) 108 (90 BASE) MCG/ACT inhaler Inhale 2 puffs into the lungs  every 4 (four) hours as needed for wheezing or shortness of breath (cough, shortness of breath or wheezing.). Patient not taking: Reported on 01/02/2023 05/31/15   [name], MD  amoxicillin (AMOXIL) 875 MG tablet Take 1 tablet (875 mg total) by mouth 2 (two) times daily. Patient not taking: Reported on 01/02/2023 05/31/15   [name], MD  predniSONE (DELTASONE) 20 MG tablet Take 2 pills daily for 3 days, then 1 daily for 3 day, then 1/2 daily for 4 days Patient not taking: Reported on 01/02/2023 05/31/15   [name], MD    ___________________________________________________________________________________________________ Physical Exam:    01/02/2023    9:29 PM 01/02/2023    7:49 PM 05/31/2015   11:14 AM  Vitals with BMI  Systolic 118 95 114  Diastolic 65 68 60  Pulse 99 114      1. General:  in  Acute distress increased work of breathing     Chronically ill   -appearing 2. Psychological: Alert and   Oriented 3. Head/ENT: Dry Mucous Membranes                          Head Non traumatic, neck supple                            Poor Dentition 4. SKIN decreased Skin turgor,  Skin clean Dry and intact no rash    5. Heart: Regular rate and rhythm no  Murmur, no Rub or gallop 6. Lungs:significant wheezes some crackles   7. Abdomen: Soft,  non-tender, Non distended  bowel sounds present 8. Lower extremities: no clubbing, cyanosis, no  edema 9. Neurologically Grossly intact, moving all 4 extremities equally   10. MSK: Normal range of motion    Chart has been reviewed  ______________________________________________________________________________________________  Assessment/Plan  89 y.o. female with medical history significant of  HTN, COPD, DM2, hypothyrodism   Admitted for  CAP, UTI SEPSIS AKI  Elevated lactic acid level   Hypoxia     Present on Admission:  AKI (acute kidney injury) (HCC)  CAP (community acquired pneumonia)  Sepsis (HCC)  COPD mixed type (HCC)   Acute respiratory failure with hypoxia (HCC)  COPD with acute exacerbation (HCC)  Hypothyroidism  Elevated CK  UTI (urinary tract infection)   AKI (acute kidney injury) (HCC) -  evidence of acute renal failure due to presence of following: Cr increased >0.3 from baseline  likely secondary to dehydration,        check FeNA       Rehydrate with IV fluids      HOLD  ACE/ARBi and nephrotoxic medications     CAP (community acquired pneumonia)  - -Patient presenting with  productive cough, fever    hypoxia  , and infiltrate in   lower lobe on chest x-ray -Infiltrate on CXR and 2-3 characteristics (fever, leukocytosis, purulent sputum) are consistent with pneumonia. -This appears to be most likely community-acquired pneumonia.      will admit for treatment of CAP will start on appropriate antibiotic coverage. - Rocephin/azithromycin   Obtain:  sputum cultures,                  Obtain respiratory panel                                      COVID PCR  Pending                   blood cultures and sputum cultures ordered                   strep pneumo UA antigen,                    check for Legionella antigen.                Provide oxygen as needed.    Sepsis (HCC)  -SIRS criteria met with  elevated white blood cell count,       Component Value Date/Time   WBC 17.5 (H) 01/02/2023 1949   LYMPHSABS 0.7 01/02/2023 1949     tachycardia   ,   fever   RR >20 Today's Vitals   01/02/23 1925 01/02/23 1949 01/02/23 2129  BP:  95/68 118/65  Pulse:  (!) 114 99  Resp:  (!) 31 (!) 26  Temp:  98.8 F (37.1 C) 98.2 F (36.8 C)  SpO2:  99% 98%  PainSc: 0-No pain        The recent clinical data is shown below. Vitals:   01/02/23 1949 01/02/23 2129  BP: 95/68 118/65  Pulse: (!) 114 99  Resp: (!) 31 (!) 26  Temp: 98.8 F (37.1 C) 98.2 F (36.8 C)  SpO2: 99% 98%          -Most likely source being: pulmonary      Patient meeting criteria for Severe sepsis with    evidence of  end organ damage/organ dysfunction such as   Acute Kidney Injury with Cr > 2,  Lab Results  Component Value Date   CREATININE 2.32 (H) 01/02/2023    elevated lactic acid >2     Component Value Date/Time   LATICACIDVEN 2.9 (HH) 01/02/2023 1949     Acute hypoxia requiring new supplemental oxygen, SpO2: 98 %       - Obtain serial lactic acid and procalcitonin level.  - Initiated IV antibiotics in ER: Antibiotics Given (last 72 hours)     Date/Time Action Medication Dose Rate   01/02/23 2019 New Bag/Given   cefTRIAXone (ROCEPHIN) 2 g in sodium chloride 0.9 % 100 mL IVPB 2 g 200 mL/hr   01/02/23 2019 New Bag/Given   azithromycin (ZITHROMAX) 500 mg in sodium chloride 0.9 % 250 mL IVPB 500 mg 250 mL/hr       Will continue  on : rocephin azithro   - await results of blood and urine culture  - Rehydrate aggressively  Intravenous fluids were administered,   Now seems to be fluid up  Will hold off on on over aggressive fluid ressusitation   10:01 PM   COPD mixed type (HCC) Likely contributing to the symptoms although CAP seems more likely Cont home meds    Acute respiratory failure with hypoxia (HCC)  this patient has acute respiratory failure with Hypoxia and  as documented by the presence of following: O2 saturatio< 90% on RA  Likely due to:   Pneumonia,  Provide O2 therapy and titrate as needed   Continuous pulse ox   check Pulse ox with ambulation prior to discharge   may need  TC consult for home O2 set up  Will dc iv fluid CXR showing fluid overload  flutter valve ordered Check echo in AM   COPD with acute exacerbation (HCC)  -  - Will initiate: Steroid taper  -  Antibiotics rocephin - Albuterol  PRN, - scheduled duoneb,  -  Breo or Dulera at discharge   -  Mucinex.  Titrate O2 to saturation >90%. Follow patients respiratory status.  Order nfluenza PCR   VBG ordered  -    -   BiPAP ordered PRN for increased work of breathing.  Currently reports she is  feeling sleepy Patient is DNR/DNI Overall poor prognosis Palliative care consult   Hypothyroidism - Check TSH continue home medications Given elevated TSH will check t3 t4 and increased  Synthroid po q day Need to clarify in Am w family if pt has been compliant   Elevated CK Pt seems to be fluid up  Recheck in AM   UTI (urinary tract infection)  - treat with Rocephin         await results of urine culture and adjust antibiotic coverage as needed    Other plan as per orders.  DVT prophylaxis:  SCD     Code Status:    DNR/DNI if patient continues to decompensate family would like to transition to comfort care as per patient  family  I had personally discussed CODE STATUS with  family  ACP none  mily Communication:   Family not at  Bedside  plan of care was discussed on the phone with  Daughter,   Diet  Diet Orders (From admission, onward)     Start     Ordered   01/02/23 1959  Diet NPO time specified  (Septic presentation on arrival (screening labs, nursing and treatment orders for obvious sepsis))  Diet effective now        01/02/23 2000            Disposition Plan:     likely will need placement for rehabilitation                          Following barriers for discharge:                            Electrolytes corrected  Afebrile, white count improving able to transition to PO antibiotics                            Will likely need home health, home O2, set up                            Consult Orders  (From admission, onward)           Start     Ordered   01/02/23 2235  Consult to Palliative Care  Once       Provider:  (Not yet assigned)  Question Answer Comment  Palliative Care Consult Services Palliative Medicine Consult   Reason for Consult? goals of care      01/02/23 2235   01/02/23 2139  Consult to hospitalist  Once       Provider:  (Not yet assigned)  Question Answer  Comment  Place call to: [facility]   Reason for Consult Admit      01/02/23 2138                              Palliative care    consulted                 Consults called: none     Admission status:  ED Disposition     ED Disposition  Admit   Condition  --   Comment  Hospital [facility] [[id number]]  Level of Care: Stepdown [14]  Admit to SDU based on following criteria: Respiratory Distress:  Frequent assessment and/or intervention to maintain adequate ventilation/respiration, pulmonary toilet, and respiratory treatment.  May admit patient to [facility] or [facility] if equivalent level of care is available:: No  Covid Evaluation: Symptomatic Person Under Investigation (PUI) or recent exposure (last 10 days) *Testing Required*  Diagnosis: COPD with acute exacerbation [facility]) [[id number]]  Admitting Physician: Therisa Doyne [[id number]]  Attending Physician: Therisa Doyne [[id number]]  Certification:: I certify there are rare and unusual circumstances requiring inpatient admission  Estimated Length of Stay: 2          inpatient     I Expect 2 midnight stay secondary to severity of patient's current illness need for inpatient interventions justified by the following:  hemodynamic instability despite optimal treatment (tachycardia  hypotension  tachypnea  hypoxia,  )  Severe lab/radiological/exam abnormalities including:    CAP and extensive comorbidities including:   COPD/asthma   That are currently affecting medical management.   I expect  patient to be hospitalized for 2 midnights requiring inpatient medical care.  Patient is at high risk for adverse outcome (such as loss of life or disability) if not treated.  Indication for inpatient stay as follows:  Severe change from baseline regarding mental status    New or worsening hypoxia   Need for IV antibiotics, IV fluids, , need for biPAP    Level of care   stepdown tele  indefinitely please discontinue once patient no longer qualifies COVID-19 Labs     Patient is critically ill due to  hemodynamic instability  respiratory failure severe sepsis  They are at high risk for life/limb threatening clinical deterioration requiring frequent reassessment and modifications of care.  Services provided include examination of the patient, review of relevant ancillary tests, prescription of lifesaving therapies, review of medications and prophylactic  therapy.  Total critical care time excluding separately billable procedures: 60  Minutes.    Jasiel Belisle 01/03/2023, 12:35 AM    [facility]     after 2 AM please page floor coverage PA If 7AM-7PM, please contact the day team taking care of the patient using [email or web address]

## 2023-01-02 NOTE — Sepsis Progress Note (Signed)
[facility] monitoring for the code sepsis protocol.  

## 2023-01-02 NOTE — ED Provider Notes (Signed)
EMERGENCY DEPARTMENT AT [facility] Provider Note   CSN: [id number] Arrival date & time: 01/02/23  4098     History  Chief Complaint  Patient presents with   Emesis   Altered Mental Status    Diamond Holland is a 89 y.o. female, history of COPD, diabetes, who presents to the ED secondary to nausea x 2 episodes yesterday, and then sudden fatigue, shortness of breath today.  History limited given patient's hard of hearing.  Daughter gives most of history.  Per daughter patient had 2 episodes of vomiting yesterday after taking some pills..  Very fatigued, so she went to bed, woke up this morning and could not hardly get out of bed, and the nurse she is very short of breath.  EMS came there and she was 80% on room air.  She is not on any oxygen at home.  Has been given multiple DuoNebs, and continues to desat.  Daughter states that she is slightly confused and very tired appearing.  No history of CHF.  Patient denies any chest pain, fevers, chills.   Home Medications Prior to Admission medications   Medication Sig Start Date End Date Taking? Authorizing Provider  aspirin EC 81 MG tablet Take 81 mg by mouth daily. Swallow whole.   Yes [provider]  Calcium Citrate-Vitamin D (CALCIUM CITRATE + D PO) Take 1 tablet by mouth daily.   Yes [provider]  diphenhydrAMINE (BENADRYL) 25 mg capsule Take 25 mg by mouth every 6 (six) hours as needed for itching, allergies or sleep.   Yes [provider]  Glucosamine-Chondroitin-MSM 375-300-237.5 MG CAPS Take 1 capsule by mouth daily.   Yes [provider]  LIVALO 2 MG TABS Take 2 mg by mouth in the morning.   Yes [provider]  losartan (COZAAR) 50 MG tablet Take 50 mg by mouth at bedtime.   Yes [provider]  Multiple Vitamins-Minerals (MULTI FOR HER 50+) TABS Take 1 tablet by mouth daily with breakfast.   Yes [provider]  Omega-3 Fatty Acids (OMEGA 3 500  PO) Take 500 mg by mouth daily.   Yes [provider]  pantoprazole (PROTONIX) 20 MG tablet Take 20 mg by mouth daily before breakfast.   Yes [provider]  Phenylephrine-DM-GG-APAP (SEVERE COLD & FLU PO) Take 1 capsule by mouth every 4 (four) hours as needed (for COPD symptoms).   Yes [provider]  SYNTHROID 100 MCG tablet Take 100 mcg by mouth daily before breakfast.   Yes [provider]  triamterene-hydrochlorothiazide (DYAZIDE) 37.5-25 MG per capsule Take 1 capsule by mouth in the morning.   Yes [provider]  albuterol (PROVENTIL HFA;VENTOLIN HFA) 108 (90 BASE) MCG/ACT inhaler Inhale 2 puffs into the lungs every 4 (four) hours as needed for wheezing or shortness of breath (cough, shortness of breath or wheezing.). Patient not taking: Reported on 01/02/2023 05/31/15   [name], MD  amoxicillin (AMOXIL) 875 MG tablet Take 1 tablet (875 mg total) by mouth 2 (two) times daily. Patient not taking: Reported on 01/02/2023 05/31/15   [name], MD  predniSONE (DELTASONE) 20 MG tablet Take 2 pills daily for 3 days, then 1 daily for 3 day, then 1/2 daily for 4 days Patient not taking: Reported on 01/02/2023 05/31/15   [name], MD      Allergies    Tape and Codeine    Review of Systems   Review of Systems  Constitutional:  Negative for fever.  Respiratory:  Positive for shortness of breath.   Cardiovascular:  Negative for chest pain.  Gastrointestinal:  Positive for vomiting.    Physical Exam Updated Vital Signs BP 118/65 (BP Location: Right Arm)   Pulse 99   Temp 98.2 F (36.8 C)   Resp (!) 26   SpO2 98%  Physical Exam Vitals and nursing note reviewed.  Constitutional:      Appearance: She is well-developed. She is ill-appearing.  HENT:     Head: Normocephalic and atraumatic.  Eyes:     Conjunctiva/sclera: Conjunctivae normal.  Cardiovascular:     Rate and Rhythm: Regular rhythm. Tachycardia present.     Heart  sounds: No murmur heard. Pulmonary:     Effort: Tachypnea present.     Breath sounds: Decreased air movement present.     Comments: +5L O2 Abdominal:     Palpations: Abdomen is soft.     Tenderness: There is no abdominal tenderness.  Musculoskeletal:        General: No swelling.     Cervical back: Neck supple.  Skin:    General: Skin is warm and dry.     Capillary Refill: Capillary refill takes less than 2 seconds.  Neurological:     Mental Status: She is alert.  Psychiatric:        Mood and Affect: Mood normal.     ED Results / Procedures / Treatments   Labs (all labs ordered are listed, but only abnormal results are displayed) Labs Reviewed  COMPREHENSIVE METABOLIC PANEL - Abnormal; Notable for the following components:      Result Value   Sodium 131 (*)    Chloride 92 (*)    Glucose, Bld 139 (*)    BUN 41 (*)    Creatinine, Ser 2.32 (*)    Albumin 3.3 (*)    AST 51 (*)    GFR, Estimated 19 (*)    All other components within normal limits  LACTIC ACID, PLASMA - Abnormal; Notable for the following components:   Lactic Acid, Venous 2.9 (*)    All other components within normal limits  CBC WITH DIFFERENTIAL/PLATELET - Abnormal; Notable for the following components:   WBC 17.5 (*)    Neutro Abs 15.6 (*)    Abs Immature Granulocytes 0.39 (*)    All other components within normal limits  PROTIME-INR - Abnormal; Notable for the following components:   Prothrombin Time 16.6 (*)    INR 1.3 (*)    All other components within normal limits  CULTURE, BLOOD (ROUTINE X 2)  CULTURE, BLOOD (ROUTINE X 2)  RESP PANEL BY RT-PCR (RSV, FLU A&B, COVID)  RVPGX2  URINE CULTURE  EXPECTORATED SPUTUM ASSESSMENT W GRAM STAIN, RFLX TO RESP C  SARS CORONAVIRUS 2 BY RT PCR  RESPIRATORY PANEL BY PCR  LACTIC ACID, PLASMA  URINALYSIS, W/ REFLEX TO CULTURE (INFECTION SUSPECTED)  LACTIC ACID, PLASMA  LACTIC ACID, PLASMA  APTT  URINALYSIS, ROUTINE W REFLEX MICROSCOPIC  PROCALCITONIN  STREP  PNEUMONIAE URINARY ANTIGEN  LEGIONELLA PNEUMOPHILA SEROGP 1 UR AG  CK  BLOOD GAS, VENOUS  AMMONIA  MAGNESIUM  PHOSPHORUS  OSMOLALITY  OSMOLALITY, URINE  CREATININE, URINE, RANDOM  TSH  SODIUM, URINE, RANDOM  PREALBUMIN  TROPONIN I (HIGH SENSITIVITY)  TROPONIN I (HIGH SENSITIVITY)    EKG EKG Interpretation  Date/Time:  Tuesday January 02 2023 19:40:38 EDT Ventricular Rate:  115 PR Interval:  155 QRS Duration: 76 QT Interval:  315 QTC  Calculation: 436 R Axis:   75 Text Interpretation: Sinus tachycardia Multiple ventricular premature complexes Anterior infarct, old Borderline repolarization abnormality Confirmed by Gwyneth Sprout ([id number]) on 01/02/2023 8:38:21 PM  Radiology DG Chest Port 1 View  Result Date: 01/02/2023 CLINICAL DATA:  Sepsis. EXAM: PORTABLE CHEST 1 VIEW COMPARISON:  Radiograph 03/27/2017 FINDINGS: Heart is normal in size. Aortic tortuosity and atherosclerosis. Ill-defined opacity at the right lung base peripherally may represent airspace disease, effusion or combination there of. Question of left upper lobe nodular density. Peribronchial thickening appears chronic. No pneumothorax. IMPRESSION: 1. Ill-defined opacity at the right lung base peripherally may represent airspace disease, effusion or combination of thereof. 2. Question of left upper lobe nodular density. Chest CT is recommended for characterization. 3. Peribronchial thickening appears chronic. Electronically Signed   By: Narda Rutherford M.D.   On: 01/02/2023 20:41    Procedures Procedures    Medications Ordered in ED Medications  lactated ringers infusion ( Intravenous New Bag/Given 01/02/23 2025)  cefTRIAXone (ROCEPHIN) 2 g in sodium chloride 0.9 % 100 mL IVPB (0 g Intravenous Stopped 01/02/23 2047)  azithromycin (ZITHROMAX) 500 mg in sodium chloride 0.9 % 250 mL IVPB (0 mg Intravenous Stopped 01/02/23 2118)  albuterol (PROVENTIL) (2.5 MG/3ML) 0.083% nebulizer solution 2.5 mg (has no administration in  time range)  ipratropium-albuterol (DUONEB) 0.5-2.5 (3) MG/3ML nebulizer solution 3 mL (has no administration in time range)  lactated ringers bolus 1,000 mL (0 mLs Intravenous Stopped 01/02/23 2153)    And  lactated ringers bolus 1,000 mL (0 mLs Intravenous Stopped 01/02/23 2154)    And  lactated ringers bolus 250 mL (250 mLs Intravenous New Bag/Given 01/02/23 2154)    ED Course/ Medical Decision Making/ A&P                             Medical Decision Making Patient is a 89 year old female, here for nausea x 2 episodes yesterday and then sudden episode of fatigue.  She is hypoxic requiring 5 L of O2, and is tachycardic.  We will obtain an x-ray of her chest, as well as urinalysis, blood work.  And start her on IV fluids.  She is meeting sepsis criteria at this time, this was sepsis criteria/action plan was initiated.  She was started on ceftriaxone and azithromycin.  Amount and/or Complexity of Data Reviewed Labs: ordered.    Details: Leukocytosis of 17 K, creat of 2.32, elevated lactic acid of 2.9 Radiology: ordered.    Details: Chest x-ray shows ill-defined opacity in the right lung base, as well as a questionable finding on the left upper lobe ECG/medicine tests: ordered.    Details: Tachycardia Discussion of management or test interpretation with external provider(s): Discussed and staffed with Dr. Anitra Lauth, patient is tachycardic, requiring oxygen, and has findings suggestive of pneumonia.  She was given a large bolus of fluids, and her blood pressure improved, and heart rate improved.  She is feeling better and talking with [place].  She started on antibiotics.  I discussed this with Dr. Adela Glimpse and she accepts admission of this patient.  Patient requires admission secondary to concern for pneumonia, and possible blood infection.  We have that she also has an AKI given her elevated BUN, and kidney function.  She has an unknown Baseline Kidney function, per our records.  CRITICAL  CARE Performed by: Pete Pelt   Total critical care time: 35+ minutes  Critical care time was exclusive of separately billable procedures  and treating other patients.  Critical care was necessary to treat or prevent imminent or life-threatening deterioration.  Critical care was time spent personally by me on the following activities: development of treatment plan with patient and/or surrogate as well as nursing, discussions with consultants, evaluation of patient's response to treatment, examination of patient, obtaining history from patient or surrogate, ordering and performing treatments and interventions, ordering and review of laboratory studies, ordering and review of radiographic studies, pulse oximetry and re-evaluation of patient's condition.   Risk Prescription drug management. Decision regarding hospitalization.    Final Clinical Impression(s) / ED Diagnoses Final diagnoses:  Pneumonia due to infectious organism, unspecified laterality, unspecified part of lung  AKI (acute kidney injury) (HCC)  Elevated lactic acid level  SIRS (systemic inflammatory response syndrome) (HCC)  Hypoxia    Rx / DC Orders ED Discharge Orders     None         Sonali Wivell, [place]ley [place], PA 01/02/23 2217    Gwyneth Sprout, MD 01/02/23 2303

## 2023-01-02 NOTE — Assessment & Plan Note (Signed)
Likely contributing to the symptoms although CAP seems more likely Cont home meds

## 2023-01-02 NOTE — Assessment & Plan Note (Signed)
-    evidence of acute renal failure due to presence of following: Cr increased >0.3 from baseline   likely secondary to dehydration,       check FeNA       Rehydrate with IV fluids      HOLD  ACE/ARBi and nephrotoxic medications    

## 2023-01-02 NOTE — Progress Notes (Signed)
   01/02/23 2236  BiPAP/CPAP/SIPAP  $ Non-Invasive Ventilator  Non-Invasive Vent Initial  $ Face Mask Medium Yes  BiPAP/CPAP/SIPAP Pt Type Adult  BiPAP/CPAP/SIPAP V60  Mask Type Full face mask  Mask Size Medium  Set Rate 10 breaths/min  Respiratory Rate 26 breaths/min  IPAP 10 cmH20  EPAP 5 cmH2O  FiO2 (%) 40 %  Flow Rate 0 lpm  Minute Ventilation 9.7  Peak Inspiratory Pressure (PIP) 11  Tidal Volume (Vt) 432  Patient Home Equipment No  Auto Titrate No  Press High Alarm 35 cmH2O  Press Low Alarm 5 cmH2O  BiPAP/CPAP /SiPAP Vitals  Bilateral Breath Sounds Clear;Diminished   PT placed on Bipap per MD, tolerating well at this time.

## 2023-01-02 NOTE — Assessment & Plan Note (Signed)
-  SIRS criteria met with  elevated white blood cell count,       Component Value Date/Time   WBC 17.5 (H) 01/02/2023 1949   LYMPHSABS 0.7 01/02/2023 1949     tachycardia   ,   fever   RR >20 Today's Vitals   01/02/23 1925 01/02/23 1949 01/02/23 2129  BP:  95/68 118/65  Pulse:  (!) 114 99  Resp:  (!) 31 (!) 26  Temp:  98.8 F (37.1 C) 98.2 F (36.8 C)  SpO2:  99% 98%  PainSc: 0-No pain        The recent clinical data is shown below. Vitals:   01/02/23 1949 01/02/23 2129  BP: 95/68 118/65  Pulse: (!) 114 99  Resp: (!) 31 (!) 26  Temp: 98.8 F (37.1 C) 98.2 F (36.8 C)  SpO2: 99% 98%          -Most likely source being: pulmonary      Patient meeting criteria for Severe sepsis with    evidence of end organ damage/organ dysfunction such as   Acute Kidney Injury with Cr > 2,  Lab Results  Component Value Date   CREATININE 2.32 (H) 01/02/2023    elevated lactic acid >2     Component Value Date/Time   LATICACIDVEN 2.9 (HH) 01/02/2023 1949     Acute hypoxia requiring new supplemental oxygen, SpO2: 98 %       - Obtain serial lactic acid and procalcitonin level.  - Initiated IV antibiotics in ER: Antibiotics Given (last 72 hours)     Date/Time Action Medication Dose Rate   01/02/23 2019 New Bag/Given   cefTRIAXone (ROCEPHIN) 2 g in sodium chloride 0.9 % 100 mL IVPB 2 g 200 mL/hr   01/02/23 2019 New Bag/Given   azithromycin (ZITHROMAX) 500 mg in sodium chloride 0.9 % 250 mL IVPB 500 mg 250 mL/hr       Will continue  on : rocephin azithro   - await results of blood and urine culture  - Rehydrate aggressively  Intravenous fluids were administered,   Now seems to be fluid up  Will hold off on on over aggressive fluid ressusitation   10:01 PM

## 2023-01-02 NOTE — Assessment & Plan Note (Signed)
- -  Patient presenting with  productive cough, fever    hypoxia  , and infiltrate in   lower lobe on chest x-ray -Infiltrate on CXR and 2-3 characteristics (fever, leukocytosis, purulent sputum) are consistent with pneumonia. -This appears to be most likely community-acquired pneumonia.      will admit for treatment of CAP will start on appropriate antibiotic coverage. - Rocephin/azithromycin   Obtain:  sputum cultures,                  Obtain respiratory panel                                      COVID PCR  Pending                   blood cultures and sputum cultures ordered                   strep pneumo UA antigen,                    check for Legionella antigen.                Provide oxygen as needed.

## 2023-01-02 NOTE — ED Notes (Signed)
ED TO INPATIENT HANDOFF REPORT  Name/Age/Gender Diamond Holland 89 y.o. female  Code Status    Code Status Orders  (From admission, onward)           Start     Ordered   01/02/23 2234  Do not attempt resuscitation (DNR)  Continuous       Question Answer Comment  If patient has no pulse and is not breathing Do Not Attempt Resuscitation   If patient has a pulse and/or is breathing: Medical Treatment Goals LIMITED ADDITIONAL INTERVENTIONS: Use medication/IV fluids and cardiac monitoring as indicated; Do not use intubation or mechanical ventilation (DNI), also provide comfort medications.  Transfer to Progressive/Stepdown as indicated, avoid Intensive Care.   Consent: Discussion documented in EHR or advanced directives reviewed      01/02/23 2235           Code Status History     This patient has a current code status but no historical code status.       Home/SNF/Other Home  Chief Complaint COPD with acute exacerbation (HCC) [J44.1]  Level of Care/Admitting Diagnosis ED Disposition     ED Disposition  Admit   Condition  --   Comment  Hospital Area: [facility] [[id number]]  Level of Care: Stepdown [14]  Admit to SDU based on following criteria: Respiratory Distress:  Frequent assessment and/or intervention to maintain adequate ventilation/respiration, pulmonary toilet, and respiratory treatment.  May admit patient to [facility] or [facility] if equivalent level of care is available:: No  Covid Evaluation: Symptomatic Person Under Investigation (PUI) or recent exposure (last 10 days) *Testing Required*  Diagnosis: COPD with acute exacerbation [facility]) [[id number]]  Admitting Physician: Therisa Doyne [[id number]]  Attending Physician: Therisa Doyne [[id number]]  Certification:: I certify there are rare and unusual circumstances requiring inpatient admission  Estimated Length of Stay: 2          Medical History Past Medical History:  Diagnosis  Date   Allergy    Arthritis    Asthma    COPD (chronic obstructive pulmonary disease) (HCC)    Diabetes mellitus without complication (HCC)    Emphysema of lung (HCC)    Osteoporosis    Thyroid disease     Allergies Allergies  Allergen Reactions   Tape Other (See Comments)    SKIN IS VERY DELICATE AND WILL TEAR AND BRUISE EASILY!!!!   Codeine Other (See Comments)    Stomach pain    IV Location/Drains/Wounds Patient Lines/Drains/Airways Status     Active Line/Drains/Airways     Name Placement date Placement time Site Days   Peripheral IV 01/02/23 20 G Anterior;Left Forearm 01/02/23  1958  Forearm  less than 1   Peripheral IV 01/02/23 20 G Anterior;Right Forearm 01/02/23  1959  Forearm  less than 1            Labs/Imaging Results for orders placed or performed during the hospital encounter of 01/02/23 (from the past 48 hour(s))  Comprehensive metabolic panel     Status: Abnormal   Collection Time: 01/02/23  7:49 PM  Result Value Ref Range   Sodium 131 (L) 135 - 145 mmol/L   Potassium 3.6 3.5 - 5.1 mmol/L   Chloride 92 (L) 98 - 111 mmol/L   CO2 24 22 - 32 mmol/L   Glucose, Bld 139 (H) 70 - 99 mg/dL    Comment: Glucose reference range applies only to samples taken after fasting for at least 8 hours.   BUN  41 (H) 8 - 23 mg/dL   Creatinine, Ser 1.61 (H) 0.44 - 1.00 mg/dL   Calcium 9.3 8.9 - 09.6 mg/dL   Total Protein 7.0 6.5 - 8.1 g/dL   Albumin 3.3 (L) 3.5 - 5.0 g/dL   AST 51 (H) 15 - 41 U/L   ALT 26 0 - 44 U/L   Alkaline Phosphatase 51 38 - 126 U/L   Total Bilirubin 0.5 0.3 - 1.2 mg/dL   GFR, Estimated 19 (L) >60 mL/min    Comment: (NOTE) Calculated using the CKD-EPI Creatinine Equation (2021)    Anion gap 15 5 - 15    Comment: Performed at [facility], [address], [place], [place] [address]  Lactic acid, plasma     Status: Abnormal   Collection Time: 01/02/23  7:49 PM  Result Value Ref Range   Lactic Acid, Venous 2.9 (HH) 0.5 - 1.9  mmol/L    Comment: CRITICAL RESULT CALLED TO, READ BACK BY AND VERIFIED WITH WOODY, A RN @ 2040 06/ BY CHILDRESS,E/ Performed at [facility], [address] [place], [place] [address]   CBC with Differential     Status: Abnormal   Collection Time: 01/02/23  7:49 PM  Result Value Ref Range   WBC 17.5 (H) 4.0 - 10.5 K/uL   RBC 4.21 3.87 - 5.11 MIL/uL   Hemoglobin 13.3 12.0 - 15.0 g/dL   HCT 14.7 82.9 - 56.2 %   MCV 91.9 80.0 - 100.0 fL   MCH 31.6 26.0 - 34.0 pg   MCHC 34.4 30.0 - 36.0 g/dL   RDW 13.0 86.5 - 78.4 %   Platelets 172 150 - 400 K/uL   nRBC 0.0 0.0 - 0.2 %   Neutrophils Relative % 89 %   Neutro Abs 15.6 (H) 1.7 - 7.7 K/uL   Lymphocytes Relative 4 %   Lymphs Abs 0.7 0.7 - 4.0 K/uL   Monocytes Relative 5 %   Monocytes Absolute 0.8 0.1 - 1.0 K/uL   Eosinophils Relative 0 %   Eosinophils Absolute 0.0 0.0 - 0.5 K/uL   Basophils Relative 0 %   Basophils Absolute 0.0 0.0 - 0.1 K/uL   WBC Morphology DOHLE BODIES     Comment: Mild Left Shift (1-5% metas, occ myelo) VACUOLATED NEUTROPHILS    Immature Granulocytes 2 %   Abs Immature Granulocytes 0.39 (H) 0.00 - 0.07 K/uL    Comment: Performed at [facility], [address] [place], [place] [address]  Protime-INR     Status: Abnormal   Collection Time: 01/02/23  7:49 PM  Result Value Ref Range   Prothrombin Time 16.6 (H) 11.4 - 15.2 seconds   INR 1.3 (H) 0.8 - 1.2    Comment: (NOTE) INR goal varies based on device and disease states. Performed at [facility], [address], [place], [place] [address]   Troponin I (High Sensitivity)     Status: Abnormal   Collection Time: 01/02/23  9:45 PM  Result Value Ref Range   Troponin I (High Sensitivity) 24 (H) <18 ng/L    Comment: (NOTE) Elevated high sensitivity troponin I (hsTnI) values and significant  changes across serial measurements may suggest ACS but many other  chronic and acute conditions are known to  elevate hsTnI results.  Refer to the "Links" section for chest pain algorithms and additional  guidance. Performed at [facility], [address] [place], [place] [address]   Lactic acid, plasma  Status: Abnormal   Collection Time: 01/02/23  9:48 PM  Result Value Ref Range   Lactic Acid, Venous 3.9 (HH) 0.5 - 1.9 mmol/L    Comment: CRITICAL VALUE NOTED. VALUE IS CONSISTENT WITH PREVIOUSLY REPORTED/CALLED VALUE Performed at [facility], [address], [place], [place] [address]   Resp panel by RT-PCR (RSV, Flu A&B, Covid) Anterior Nasal Swab     Status: None   Collection Time: 01/02/23  9:56 PM   Specimen: Anterior Nasal Swab  Result Value Ref Range   SARS Coronavirus 2 by RT PCR NEGATIVE NEGATIVE    Comment: (NOTE) SARS-CoV-2 target nucleic acids are NOT DETECTED.  The SARS-CoV-2 RNA is generally detectable in upper respiratory specimens during the acute phase of infection. The lowest concentration of SARS-CoV-2 viral copies this assay can detect is 138 copies/mL. A negative result does not preclude SARS-Cov-2 infection and should not be used as the sole basis for treatment or other patient management decisions. A negative result may occur with  improper specimen collection/handling, submission of specimen other than nasopharyngeal swab, presence of viral mutation(s) within the areas targeted by this assay, and inadequate number of viral copies(<138 copies/mL). A negative result must be combined with clinical observations, patient history, and epidemiological information. The expected result is Negative.  Fact Sheet for Patients:  BloggerCourse.com  Fact Sheet for Healthcare Providers:  SeriousBroker.it  This test is no t yet approved or cleared by the [place] FDA and  has been authorized for detection and/or diagnosis of SARS-CoV-2 by FDA under an Emergency Use Authorization  (EUA). This EUA will remain  in effect (meaning this test can be used) for the duration of the COVID-19 declaration under Section 564(b)(1) of the Act, 21 U.S.C.section 360bbb-3(b)(1), unless the authorization is terminated  or revoked sooner.       Influenza A by PCR NEGATIVE NEGATIVE   Influenza B by PCR NEGATIVE NEGATIVE    Comment: (NOTE) The Xpert Xpress SARS-CoV-2/FLU/RSV plus assay is intended as an aid in the diagnosis of influenza from Nasopharyngeal swab specimens and should not be used as a sole basis for treatment. Nasal washings and aspirates are unacceptable for Xpert Xpress SARS-CoV-2/FLU/RSV testing.  Fact Sheet for Patients: BloggerCourse.com  Fact Sheet for Healthcare Providers: SeriousBroker.it  This test is not yet approved or cleared by the [place] FDA and has been authorized for detection and/or diagnosis of SARS-CoV-2 by FDA under an Emergency Use Authorization (EUA). This EUA will remain in effect (meaning this test can be used) for the duration of the COVID-19 declaration under Section 564(b)(1) of the Act, 21 U.S.C. section 360bbb-3(b)(1), unless the authorization is terminated or revoked.     Resp Syncytial Virus by PCR NEGATIVE NEGATIVE    Comment: (NOTE) Fact Sheet for Patients: BloggerCourse.com  Fact Sheet for Healthcare Providers: SeriousBroker.it  This test is not yet approved or cleared by the [place] FDA and has been authorized for detection and/or diagnosis of SARS-CoV-2 by FDA under an Emergency Use Authorization (EUA). This EUA will remain in effect (meaning this test can be used) for the duration of the COVID-19 declaration under Section 564(b)(1) of the Act, 21 U.S.C. section 360bbb-3(b)(1), unless the authorization is terminated or revoked.  Performed at [facility], [address], [place],  [place] [address]   Blood gas, venous     Status: Abnormal   Collection Time: 01/02/23 10:15 PM  Result Value Ref Range   pH, Ven 7.27 7.25 - 7.43  pCO2, Ven 60 44 - 60 mmHg   pO2, Ven 31 (LL) 32 - 45 mmHg    Comment: CRITICAL RESULT CALLED TO, READ BACK BY AND VERIFIED WITH: Amrom Ore,J RN @ 2236 06/11024 BY CHILDRESS,E    Bicarbonate 27.6 20.0 - 28.0 mmol/L   Acid-base deficit 0.6 0.0 - 2.0 mmol/L   O2 Saturation 46.7 %   Patient temperature 37.0     Comment: Performed at [facility], [address] [place], [place] [address]   CT HEAD WO CONTRAST ( )  Result Date: 01/02/2023 CLINICAL DATA:  Delirium EXAM: CT HEAD WITHOUT CONTRAST TECHNIQUE: Contiguous axial images were obtained from the base of the skull through the vertex without intravenous contrast. RADIATION DOSE REDUCTION: This exam was performed according to the departmental dose-optimization program which includes automated exposure control, adjustment of the mA and/or kV according to patient size and/or use of iterative reconstruction technique. COMPARISON:  None Available. FINDINGS: Brain: Diffuse cerebral atrophy. Ventricular dilatation consistent with central atrophy. Low-attenuation changes in the deep white matter consistent with small vessel ischemia. No abnormal extra-axial fluid collections. No mass effect or midline shift. Gray-white matter junctions are distinct. Basal cisterns are not effaced. No acute intracranial hemorrhage. Vascular: No hyperdense vessel or unexpected calcification. Skull: Normal. Negative for fracture or focal lesion. Sinuses/Orbits: No acute finding. Other: None. IMPRESSION: No acute intracranial abnormalities. Chronic atrophy and small vessel ischemic changes. Electronically Signed   By: Burman Nieves M.D.   On: 01/02/2023 22:50   DG Chest Port 1 View  Result Date: 01/02/2023 CLINICAL DATA:  Sepsis. EXAM: PORTABLE CHEST 1 VIEW COMPARISON:  Radiograph 03/27/2017 FINDINGS: Heart is  normal in size. Aortic tortuosity and atherosclerosis. Ill-defined opacity at the right lung base peripherally may represent airspace disease, effusion or combination there of. Question of left upper lobe nodular density. Peribronchial thickening appears chronic. No pneumothorax. IMPRESSION: 1. Ill-defined opacity at the right lung base peripherally may represent airspace disease, effusion or combination of thereof. 2. Question of left upper lobe nodular density. Chest CT is recommended for characterization. 3. Peribronchial thickening appears chronic. Electronically Signed   By: Narda Rutherford M.D.   On: 01/02/2023 20:41    Pending Labs Unresulted Labs (From admission, onward)     Start     Ordered   01/03/23 0500  Prealbumin  Tomorrow morning,   R        01/02/23 2158   01/02/23 2245  Osmolality  Once,   AD        01/02/23 2245   01/02/23 2240  APTT  Once,   STAT        01/02/23 2240   01/02/23 2205  Respiratory (~20 pathogens) panel by PCR  (Respiratory panel by PCR (~20 pathogens, ~24 hr TAT)  w precautions)  Once,   URGENT        01/02/23 2204   01/02/23 2159  CK  Add-on,   AD        01/02/23 2158   01/02/23 2159  Ammonia  Once,   STAT       Question:  Release to patient  Answer:  Immediate   01/02/23 2158   01/02/23 2159  Magnesium  Add-on,   AD        01/02/23 2158   01/02/23 2159  Phosphorus  Add-on,   AD        01/02/23 2158   01/02/23 2159  Osmolality, urine  Once,   URGENT  01/02/23 2158   01/02/23 2159  Creatinine, urine, random  Once,   URGENT        01/02/23 2158   01/02/23 2159  TSH  Add-on,   AD        01/02/23 2158   01/02/23 2159  Sodium, urine, random  Once,   URGENT        01/02/23 2158   01/02/23 2158  Strep pneumoniae urinary antigen  Once,   URGENT        01/02/23 2158   01/02/23 2158  Legionella Pneumophila Serogp 1 Ur Ag  Once,   URGENT        01/02/23 2158   01/02/23 2158  Expectorated Sputum Assessment w Gram Stain, Rflx to Resp Cult  Once,   R         01/02/23 2158   01/02/23 2157  Lactic acid, plasma  STAT Now then every 2 hours,   R (with STAT occurrences)      01/02/23 2157   01/02/23 2157  Urinalysis, Routine w reflex microscopic -Urine, Clean Catch  ONCE - URGENT,   URGENT       Question:  Specimen Source  Answer:  Urine, Clean Catch   01/02/23 2157   01/02/23 2157  Procalcitonin  Add-on,   AD       References:    Procalcitonin Lower Respiratory Tract Infection AND Sepsis Procalcitonin Algorithm   01/02/23 2157   01/02/23 2157  Urine Culture (for pregnant, neutropenic or urologic patients or patients with an indwelling urinary catheter)  (Urine Culture)  ONCE - URGENT,   URGENT       Question:  Indication  Answer:  Sepsis   01/02/23 2157   01/02/23 1941  Culture, blood (Routine x 2)  BLOOD CULTURE X 2,   R (with STAT occurrences)      01/02/23 1941   01/02/23 1941  Urinalysis, w/ Reflex to Culture (Infection Suspected) -Urine, Clean Catch  Once,   URGENT       Question:  Specimen Source  Answer:  Urine, Clean Catch   01/02/23 1941   Signed and Held  Magnesium  Tomorrow morning,   R        Signed and Held   Signed and Held  Phosphorus  Tomorrow morning,   R        Signed and Held   Signed and Held  Comprehensive metabolic panel  Tomorrow morning,   R       Question:  Release to patient  Answer:  Immediate   Signed and Held   Signed and Held  CBC  Tomorrow morning,   R       Question:  Release to patient  Answer:  Immediate   Signed and Held   Signed and Held  MRSA Next Gen by PCR, Nasal  (COPD / Pneumonia / Cellulitis / Lower Extremity Wound)  Once,   R        Signed and Held            Vitals/Pain Today's Vitals   01/02/23 1925 01/02/23 1949 01/02/23 2129  BP:  95/68 118/65  Pulse:  (!) 114 99  Resp:  (!) 31 (!) 26  Temp:  98.8 F (37.1 C) 98.2 F (36.8 C)  SpO2:  99% 98%  PainSc: 0-No pain      Isolation Precautions Droplet precaution  Medications Medications  lactated ringers infusion ( Intravenous  New Bag/Given 01/02/23 2025)  cefTRIAXone (ROCEPHIN) 2 g  in sodium chloride 0.9 % 100 mL IVPB (0 g Intravenous Stopped 01/02/23 2047)  azithromycin (ZITHROMAX) 500 mg in sodium chloride 0.9 % 250 mL IVPB (0 mg Intravenous Stopped 01/02/23 2118)  albuterol (PROVENTIL) (2.5 MG/3ML) 0.083% nebulizer solution 2.5 mg (has no administration in time range)  ipratropium-albuterol (DUONEB) 0.5-2.5 (3) MG/3ML nebulizer solution 3 mL (3 mLs Nebulization Given 01/02/23 2236)  lactated ringers bolus 1,000 mL (0 mLs Intravenous Stopped 01/02/23 2153)    And  lactated ringers bolus 1,000 mL (0 mLs Intravenous Stopped 01/02/23 2154)    And  lactated ringers bolus 250 mL (0 mLs Intravenous Stopped 01/02/23 2235)    Mobility Wheelchair

## 2023-01-02 NOTE — Assessment & Plan Note (Signed)
-  -   Will initiate: Steroid taper  -  Antibiotics rocephin - Albuterol  PRN, - scheduled duoneb,  -  Breo or Dulera at discharge   -  Mucinex.  Titrate O2 to saturation >90%. Follow patients respiratory status.  Order nfluenza PCR   VBG ordered  -    -   BiPAP ordered PRN for increased work of breathing.  Currently reports she is feeling sleepy Patient is DNR/DNI Overall poor prognosis Palliative care consult

## 2023-01-02 NOTE — Assessment & Plan Note (Signed)
this patient has acute respiratory failure with Hypoxia and  as documented by the presence of following: O2 saturatio< 90% on RA  Likely due to:   Pneumonia,  Provide O2 therapy and titrate as needed   Continuous pulse ox   check Pulse ox with ambulation prior to discharge   may need  TC consult for home O2 set up  Will dc iv fluid CXR showing fluid overload  flutter valve ordered Check echo in AM

## 2023-01-02 NOTE — Subjective & Objective (Signed)
Comes in with confusion and generalized fatigue for the past 2 days EMS states was febrile on arrival Patient endorses nausea and vomiting Blood pressure 110/48 heart rate 120s Patient has known history of COPD and diabetes It seems that day and nausea came after patient took her medicines yesterday Satting 80% on room air on arrival At baseline not on oxygen No prior history of CHF No chest pains

## 2023-01-02 NOTE — ED Triage Notes (Signed)
Patient BIB EMS c/o AMS. Patient worsening AMS and Weakness x2 days.Patient was febrile o2 at 80% upon EMS arrival. 3L o2 and  NS given by EMS.  CBG 108 BP 110/48 HR  120 RR 30 O2sat 94%on 3L

## 2023-01-03 ENCOUNTER — Encounter (HOSPITAL_COMMUNITY): Payer: Self-pay | Admitting: Internal Medicine

## 2023-01-03 ENCOUNTER — Inpatient Hospital Stay (HOSPITAL_COMMUNITY): Payer: Medicare HMO

## 2023-01-03 DIAGNOSIS — E039 Hypothyroidism, unspecified: Secondary | ICD-10-CM | POA: Diagnosis present

## 2023-01-03 DIAGNOSIS — N179 Acute kidney failure, unspecified: Secondary | ICD-10-CM | POA: Diagnosis not present

## 2023-01-03 DIAGNOSIS — R0609 Other forms of dyspnea: Secondary | ICD-10-CM

## 2023-01-03 DIAGNOSIS — R7989 Other specified abnormal findings of blood chemistry: Secondary | ICD-10-CM | POA: Diagnosis not present

## 2023-01-03 DIAGNOSIS — J189 Pneumonia, unspecified organism: Secondary | ICD-10-CM | POA: Diagnosis not present

## 2023-01-03 DIAGNOSIS — J441 Chronic obstructive pulmonary disease with (acute) exacerbation: Secondary | ICD-10-CM | POA: Diagnosis not present

## 2023-01-03 DIAGNOSIS — R748 Abnormal levels of other serum enzymes: Secondary | ICD-10-CM | POA: Diagnosis present

## 2023-01-03 DIAGNOSIS — N390 Urinary tract infection, site not specified: Secondary | ICD-10-CM | POA: Diagnosis present

## 2023-01-03 DIAGNOSIS — R0902 Hypoxemia: Secondary | ICD-10-CM

## 2023-01-03 LAB — BLOOD CULTURE ID PANEL (REFLEXED) - BCID2

## 2023-01-03 LAB — ECHOCARDIOGRAM COMPLETE
AR max vel: 1.42 cm2
AV Peak grad: 17.6 mmHg
Ao pk vel: 2.1 m/s
Area-P 1/2: 3.65 cm2
Height: 66 in
MV M vel: 4.14 m/s
MV Peak grad: 68.6 mmHg
S' Lateral: 2.3 cm
Weight: 1932.99 oz

## 2023-01-03 LAB — RESPIRATORY PANEL BY PCR

## 2023-01-03 LAB — CK
Total CK: 870 U/L — ABNORMAL HIGH (ref 38–234)
Total CK: 805 U/L — ABNORMAL HIGH (ref 38–234)

## 2023-01-03 LAB — GLUCOSE, CAPILLARY
Glucose-Capillary: 133 mg/dL — ABNORMAL HIGH (ref 70–99)
Glucose-Capillary: 110 mg/dL — ABNORMAL HIGH (ref 70–99)
Glucose-Capillary: 181 mg/dL — ABNORMAL HIGH (ref 70–99)
Glucose-Capillary: 163 mg/dL — ABNORMAL HIGH (ref 70–99)
Glucose-Capillary: 140 mg/dL — ABNORMAL HIGH (ref 70–99)

## 2023-01-03 LAB — CBC
HCT: 32.1 % — ABNORMAL LOW (ref 36.0–46.0)
Hemoglobin: 11.1 g/dL — ABNORMAL LOW (ref 12.0–15.0)
MCH: 31.8 pg (ref 26.0–34.0)
MCHC: 34.6 g/dL (ref 30.0–36.0)
MCV: 92 fL (ref 80.0–100.0)
Platelets: 144 10*3/uL — ABNORMAL LOW (ref 150–400)
RBC: 3.49 MIL/uL — ABNORMAL LOW (ref 3.87–5.11)
RDW: 13 % (ref 11.5–15.5)
WBC: 12.9 10*3/uL — ABNORMAL HIGH (ref 4.0–10.5)
nRBC: 0 % (ref 0.0–0.2)

## 2023-01-03 LAB — COMPREHENSIVE METABOLIC PANEL
ALT: 26 U/L (ref 0–44)
AST: 50 U/L — ABNORMAL HIGH (ref 15–41)
Albumin: 2.6 g/dL — ABNORMAL LOW (ref 3.5–5.0)
Alkaline Phosphatase: 49 U/L (ref 38–126)
Anion gap: 13 (ref 5–15)
BUN: 42 mg/dL — ABNORMAL HIGH (ref 8–23)
CO2: 23 mmol/L (ref 22–32)
Calcium: 8.5 mg/dL — ABNORMAL LOW (ref 8.9–10.3)
Chloride: 94 mmol/L — ABNORMAL LOW (ref 98–111)
Creatinine, Ser: 2.03 mg/dL — ABNORMAL HIGH (ref 0.44–1.00)
GFR, Estimated: 22 mL/min — ABNORMAL LOW (ref >60)
Glucose, Bld: 151 mg/dL — ABNORMAL HIGH (ref 70–99)
Potassium: 3.4 mmol/L — ABNORMAL LOW (ref 3.5–5.1)
Sodium: 130 mmol/L — ABNORMAL LOW (ref 135–145)
Total Bilirubin: 0.7 mg/dL (ref 0.3–1.2)
Total Protein: 5.4 g/dL — ABNORMAL LOW (ref 6.5–8.1)

## 2023-01-03 LAB — PHOSPHORUS: Phosphorus: 4.1 mg/dL (ref 2.5–4.6)

## 2023-01-03 LAB — PREALBUMIN: Prealbumin: 11 mg/dL — ABNORMAL LOW (ref 18–38)

## 2023-01-03 LAB — MRSA NEXT GEN BY PCR, NASAL: MRSA by PCR Next Gen: NOT DETECTED

## 2023-01-03 LAB — MAGNESIUM: Magnesium: 2.5 mg/dL — ABNORMAL HIGH (ref 1.7–2.4)

## 2023-01-03 LAB — T4, FREE: Free T4: 0.45 ng/dL — ABNORMAL LOW (ref 0.61–1.12)

## 2023-01-03 LAB — OSMOLALITY, URINE: Osmolality, Ur: 406 mOsm/kg (ref 300–900)

## 2023-01-03 LAB — OSMOLALITY: Osmolality: 293 mOsm/kg (ref 275–295)

## 2023-01-03 LAB — LACTIC ACID, PLASMA: Lactic Acid, Venous: 3.1 mmol/L (ref 0.5–1.9)

## 2023-01-03 LAB — STREP PNEUMONIAE URINARY ANTIGEN: Strep Pneumo Urinary Antigen: NEGATIVE

## 2023-01-03 MED ORDER — MAGNESIUM SULFATE 2 GM/50ML IV SOLN
2.0000 g | Freq: Once | INTRAVENOUS | Status: AC
  Administered 2023-01-03: 2 g via INTRAVENOUS
  Filled 2023-01-03: qty 50

## 2023-01-03 MED ORDER — ACETAMINOPHEN 650 MG RE SUPP: 650.0000 mg | Freq: Four times a day (QID) | RECTAL | Status: DC | PRN

## 2023-01-03 MED ORDER — SODIUM CHLORIDE 0.9 % IV BOLUS
500.0000 mL | Freq: Once | INTRAVENOUS | Status: AC
  Administered 2023-01-03: 500 mL via INTRAVENOUS

## 2023-01-03 MED ORDER — HYDROCODONE-ACETAMINOPHEN 5-325 MG PO TABS: 1.0000 | ORAL_TABLET | ORAL | Status: DC | PRN

## 2023-01-03 MED ORDER — ORAL CARE MOUTH RINSE
15.0000 mL | OROMUCOSAL | Status: DC
  Administered 2023-01-03 – 2023-01-06 (×8): 15 mL via OROMUCOSAL

## 2023-01-03 MED ORDER — PREDNISONE 20 MG PO TABS: 40.0000 mg | ORAL_TABLET | Freq: Every day | ORAL | Status: DC

## 2023-01-03 MED ORDER — POTASSIUM CHLORIDE 10 MEQ/100ML IV SOLN
10.0000 meq | Freq: Once | INTRAVENOUS | Status: AC
  Administered 2023-01-03: 10 meq via INTRAVENOUS
  Filled 2023-01-03: qty 100

## 2023-01-03 MED ORDER — GUAIFENESIN ER 600 MG PO TB12
600.0000 mg | ORAL_TABLET | Freq: Two times a day (BID) | ORAL | Status: DC
  Administered 2023-01-03 – 2023-01-06 (×7): 600 mg via ORAL
  Filled 2023-01-03 (×7): qty 1

## 2023-01-03 MED ORDER — METHYLPREDNISOLONE SODIUM SUCC 40 MG IJ SOLR
40.0000 mg | Freq: Two times a day (BID) | INTRAMUSCULAR | Status: DC
  Administered 2023-01-03: 40 mg via INTRAVENOUS
  Filled 2023-01-03: qty 1

## 2023-01-03 MED ORDER — LEVOTHYROXINE SODIUM 50 MCG PO TABS: 50.0000 ug | ORAL_TABLET | Freq: Every day | ORAL | Status: DC

## 2023-01-03 MED ORDER — SODIUM CHLORIDE 0.9 % IV SOLN: INTRAVENOUS | Status: AC

## 2023-01-03 MED ORDER — ONDANSETRON HCL 4 MG/2ML IJ SOLN
4.0000 mg | Freq: Four times a day (QID) | INTRAMUSCULAR | Status: DC | PRN
  Administered 2023-01-05 – 2023-01-06 (×2): 4 mg via INTRAVENOUS
  Filled 2023-01-03 (×2): qty 2

## 2023-01-03 MED ORDER — CHLORHEXIDINE GLUCONATE CLOTH 2 % EX PADS
6.0000 | MEDICATED_PAD | Freq: Every day | CUTANEOUS | Status: DC
  Administered 2023-01-03 – 2023-01-06 (×4): 6 via TOPICAL

## 2023-01-03 MED ORDER — LEVOTHYROXINE SODIUM 125 MCG PO TABS
125.0000 ug | ORAL_TABLET | Freq: Every day | ORAL | Status: DC
  Administered 2023-01-03 – 2023-01-06 (×4): 125 ug via ORAL
  Filled 2023-01-03 (×4): qty 1

## 2023-01-03 MED ORDER — ORAL CARE MOUTH RINSE: 15.0000 mL | OROMUCOSAL | Status: DC | PRN

## 2023-01-03 MED ORDER — POTASSIUM CHLORIDE 10 MEQ/100ML IV SOLN: 10.0000 meq | INTRAVENOUS | Status: DC

## 2023-01-03 MED ORDER — ONDANSETRON HCL 4 MG PO TABS: 4.0000 mg | ORAL_TABLET | Freq: Four times a day (QID) | ORAL | Status: DC | PRN

## 2023-01-03 MED ORDER — PREDNISONE 10 MG PO TABS
10.0000 mg | ORAL_TABLET | Freq: Every day | ORAL | Status: AC
  Administered 2023-01-03 – 2023-01-04 (×2): 10 mg via ORAL
  Filled 2023-01-03 (×2): qty 1

## 2023-01-03 MED ORDER — ACETAMINOPHEN 325 MG PO TABS: 650.0000 mg | ORAL_TABLET | Freq: Four times a day (QID) | ORAL | Status: DC | PRN

## 2023-01-03 NOTE — Assessment & Plan Note (Signed)
-   treat with Rocephin         await results of urine culture and adjust antibiotic coverage as needed  

## 2023-01-03 NOTE — Sepsis Progress Note (Signed)
Notified bedside nurse of need to draw repeat lactic acid. 

## 2023-01-03 NOTE — Progress Notes (Signed)
Echocardiogram 2D Echocardiogram has been performed.  Diamond Holland 01/03/2023, 9:31 AM

## 2023-01-03 NOTE — Progress Notes (Signed)
Clinical swallow evaluation completed, full report to follow. Pt with minimal oral deficits due to xerostomia - resulting in minimal retention that cleared with applesauce swallow. She did not demonstrate any clinical indication of significant dysphagia with minimal po observed- dtr present and reports pt eats small frequent amounts at home and does not have dysphagia.  Known h/o GERD - and pt with frequent eructation.  Recommend reg/thin to allow family to order foods best tolerated for pt, no SLP follow up indicated.    Rolena Infante, MS [facility] SLP Acute [employer] [phone]

## 2023-01-03 NOTE — Progress Notes (Signed)
[facility] PROGRESS NOTE    Progress Note  Diamond Holland  Diamond Holland:[id number] DOB: [birth year] DOA: 01/02/2023 PCP: Ralene Ok, MD     Brief Narrative:   Diamond Holland is an 89 y.o. female past medical history significant for essential hypertension COPD comes in with confusion and fever as per family members as the patient cannot provide history generalized fatigue confusion that started 2 days prior to arrival     Assessment/Plan:   Acute respiratory failure with hypoxia secondary to community-acquired pneumonia: Started on BiPAP on the ED saturations greater 96%. Transition her to nasal cannula. Started empirically on antibiotics Rocephin and azithromycin. Blood cultures have been sent negative till date. Strep pneumo negative.  Strict I's and O's. 2D echo is pending.  She is not fluid overloaded on physical exam. Will need strict I's and O's.  Severe sepsis: With acute kidney injury and confusion. Fluid resuscitated started on antibiotics. Blood pressure is relatively stable, tachycardia has resolved. Continue empiric antibiotics. UTI has been ruled out.  Acute kidney injury: Unknown baseline. Admission to 2.3, fluid resuscitated with bolus, this morning is 2.0. Resume IV fluids recheck basic metabolic panel in the morning. ACE inhibitor has been held. Strict I's and O's.    COPD with acute exacerbation (HCC) Probably contributing continue current home meds inhalers. Continue albuterol scheduled. Currently on BiPAP. Continue inhalers antibiotics and steroids. She is currently not wheezing, will taper off antibiotic course quickly.  Hypothyroidism: Continue Synthroid.  Elevated CK: Continue IV fluids recheck CK in the morning.  Hypokalemia: Replete IV recheck in the morning.  Hypovolemic hyponatremia: Continue IV fluids.   DVT prophylaxis: lovenox Family Communication:family Status is: Inpatient Remains inpatient appropriate because: Acute  respiratory failure with hypoxia    Code Status:     Code Status Orders  (From admission, onward)           Start     Ordered   01/02/23 2234  Do not attempt resuscitation (DNR)  Continuous       Question Answer Comment  If patient has no pulse and is not breathing Do Not Attempt Resuscitation   If patient has a pulse and/or is breathing: Medical Treatment Goals LIMITED ADDITIONAL INTERVENTIONS: Use medication/IV fluids and cardiac monitoring as indicated; Do not use intubation or mechanical ventilation (DNI), also provide comfort medications.  Transfer to Progressive/Stepdown as indicated, avoid Intensive Care.   Consent: Discussion documented in EHR or advanced directives reviewed      01/02/23 2235           Code Status History     This patient has a current code status but no historical code status.         IV Access:   Peripheral IV   Procedures and diagnostic studies:   DG CHEST PORT 1 VIEW  Result Date: 01/02/2023 CLINICAL DATA:  Acute respiratory failure. EXAM: PORTABLE CHEST 1 VIEW COMPARISON:  3 hours ago. FINDINGS: Increasing hazy lung base opacities may represent developing/increasing effusions. Patchy bibasilar airspace disease. Stable heart size and mediastinal contours, aortic atherosclerosis. Question of nodular density in the left upper lobe again seen. No pneumothorax. IMPRESSION: 1. Increasing hazy lung base opacities may represent developing/increasing effusions. 2. Patchy bibasilar airspace disease, atelectasis versus pneumonia. 3. Question of left upper lobe nodule again seen, recommend CT. Electronically Signed   By: Narda Rutherford M.D.   On: 01/02/2023 23:07   CT HEAD WO CONTRAST ( )  Result Date: 01/02/2023 CLINICAL DATA:  Delirium EXAM: CT HEAD  WITHOUT CONTRAST TECHNIQUE: Contiguous axial images were obtained from the base of the skull through the vertex without intravenous contrast. RADIATION DOSE REDUCTION: This exam was performed  according to the departmental dose-optimization program which includes automated exposure control, adjustment of the mA and/or kV according to patient size and/or use of iterative reconstruction technique. COMPARISON:  None Available. FINDINGS: Brain: Diffuse cerebral atrophy. Ventricular dilatation consistent with central atrophy. Low-attenuation changes in the deep white matter consistent with small vessel ischemia. No abnormal extra-axial fluid collections. No mass effect or midline shift. Gray-white matter junctions are distinct. Basal cisterns are not effaced. No acute intracranial hemorrhage. Vascular: No hyperdense vessel or unexpected calcification. Skull: Normal. Negative for fracture or focal lesion. Sinuses/Orbits: No acute finding. Other: None. IMPRESSION: No acute intracranial abnormalities. Chronic atrophy and small vessel ischemic changes. Electronically Signed   By: Burman Nieves M.D.   On: 01/02/2023 22:50   DG Chest Port 1 View  Result Date: 01/02/2023 CLINICAL DATA:  Sepsis. EXAM: PORTABLE CHEST 1 VIEW COMPARISON:  Radiograph 03/27/2017 FINDINGS: Heart is normal in size. Aortic tortuosity and atherosclerosis. Ill-defined opacity at the right lung base peripherally may represent airspace disease, effusion or combination there of. Question of left upper lobe nodular density. Peribronchial thickening appears chronic. No pneumothorax. IMPRESSION: 1. Ill-defined opacity at the right lung base peripherally may represent airspace disease, effusion or combination of thereof. 2. Question of left upper lobe nodular density. Chest CT is recommended for characterization. 3. Peribronchial thickening appears chronic. Electronically Signed   By: Narda Rutherford M.D.   On: 01/02/2023 20:41     Medical Consultants:   None.   Subjective:    [name] awake this morning no complaints  Objective:    Vitals:   01/03/23 0400 01/03/23 0430 01/03/23 0500 01/03/23 0600  BP: (!) 93/36  (!)  96/40 (!) 108/45  Pulse: 100  98 92  Resp: (!) 21  19 19   Temp:  99.5 F (37.5 C)    TempSrc:  Axillary    SpO2: 95%  96% 100%  Weight:      Height:       SpO2: 100 % FiO2 (%): 45 % (spo2 97% on 50% fio2, weaned down to 45%, rn aware)   Intake/Output Summary (Last 24 hours) at 01/03/2023 0642 Last data filed at 01/03/2023 0552 Gross per 24 hour  Intake 3605.01 ml  Output 400 ml  Net 3205.01 ml   Filed Weights   01/03/23 0011  Weight: 54.8 kg    Exam: General exam: In no acute distress. Respiratory system: Good air movement and clear to auscultation.  No wheezing Cardiovascular system: S1 & S2 heard, RRR. No JVD. Gastrointestinal system: Abdomen is nondistended, soft and nontender.  Extremities: No pedal edema. Skin: No rashes, lesions or ulcers Psychiatry: Judgement and insight appear normal. Mood & affect appropriate.    Data Reviewed:    Labs: Basic Metabolic Panel: Recent Labs  Lab 01/02/23 1949 01/02/23 2215 01/03/23 0217  NA 131*  --  130*  K 3.6  --  3.4*  CL 92*  --  94*  CO2 24  --  23  GLUCOSE 139*  --  151*  BUN 41*  --  42*  CREATININE 2.32*  --  2.03*  CALCIUM 9.3  --  8.5*  MG  --  1.5* 2.5*  PHOS  --  4.2 4.1   GFR Estimated Creatinine Clearance: 15 mL/min (A) (by C-G formula based on SCr of 2.03 mg/dL (H)).  Liver Function Tests: Recent Labs  Lab 01/02/23 1949 01/03/23 0217  AST 51* 50*  ALT 26 26  ALKPHOS 51 49  BILITOT 0.5 0.7  PROT 7.0 5.4*  ALBUMIN 3.3* 2.6*   No results for input(s): "LIPASE", "AMYLASE" in the last 168 hours. Recent Labs  Lab 01/02/23 01-14-14  AMMONIA 19   Coagulation profile Recent Labs  Lab 01/02/23 1949  INR 1.3*   COVID-19 Labs  No results for input(s): "DDIMER", "FERRITIN", "LDH", "CRP" in the last 72 hours.  Lab Results  Component Value Date   SARSCOV2NAA NEGATIVE 01/02/2023    CBC: Recent Labs  Lab 01/02/23 1949 01/03/23 0217  WBC 17.5* 12.9*  NEUTROABS 15.6*  --   HGB 13.3 11.1*   HCT 38.7 32.1*  MCV 91.9 92.0  PLT 172 144*   Cardiac Enzymes: Recent Labs  Lab 01/02/23 Jan 14, 2214 01/03/23 0217  CKTOTAL 870* 805*   BNP (last 3 results) No results for input(s): "PROBNP" in the last 8760 hours. CBG: Recent Labs  Lab 01/03/23 0340  GLUCAP 133*   D-Dimer: No results for input(s): "DDIMER" in the last 72 hours. Hgb A1c: No results for input(s): "HGBA1C" in the last 72 hours. Lipid Profile: No results for input(s): "CHOL", "HDL", "LDLCALC", "TRIG", "CHOLHDL", "LDLDIRECT" in the last 72 hours. Thyroid function studies: Recent Labs    01/02/23 14-Jan-2214  TSH 18.142*   Anemia work up: No results for input(s): "VITAMINB12", "FOLATE", "FERRITIN", "TIBC", "IRON", "RETICCTPCT" in the last 72 hours. Sepsis Labs: Recent Labs  Lab 01/02/23 1949 01/02/23 2148 01/02/23 01/14/14 01/03/23 0217  PROCALCITON  --   --  21.33  --   WBC 17.5*  --   --  12.9*  LATICACIDVEN 2.9* 3.9*  --  3.1*   Microbiology Recent Results (from the past 240 hour(s))  Resp panel by RT-PCR (RSV, Flu A&B, Covid) Anterior Nasal Swab     Status: None   Collection Time: 01/02/23  9:56 PM   Specimen: Anterior Nasal Swab  Result Value Ref Range Status   SARS Coronavirus 2 by RT PCR NEGATIVE NEGATIVE Final    Comment: (NOTE) SARS-CoV-2 target nucleic acids are NOT DETECTED.  The SARS-CoV-2 RNA is generally detectable in upper respiratory specimens during the acute phase of infection. The lowest concentration of SARS-CoV-2 viral copies this assay can detect is 138 copies/mL. A negative result does not preclude SARS-Cov-2 infection and should not be used as the sole basis for treatment or other patient management decisions. A negative result may occur with  improper specimen collection/handling, submission of specimen other than nasopharyngeal swab, presence of viral mutation(s) within the areas targeted by this assay, and inadequate number of viral copies(<138 copies/mL). A negative result must be  combined with clinical observations, patient history, and epidemiological information. The expected result is Negative.  Fact Sheet for Patients:  BloggerCourse.com  Fact Sheet for Healthcare Providers:  SeriousBroker.it  This test is no t yet approved or cleared by the [place] FDA and  has been authorized for detection and/or diagnosis of SARS-CoV-2 by FDA under an Emergency Use Authorization (EUA). This EUA will remain  in effect (meaning this test can be used) for the duration of the COVID-19 declaration under Section 564(b)(1) of the Act, 21 U.S.C.section 360bbb-3(b)(1), unless the authorization is terminated  or revoked sooner.       Influenza A by PCR NEGATIVE NEGATIVE Final   Influenza B by PCR NEGATIVE NEGATIVE Final    Comment: (NOTE) The Xpert Xpress SARS-CoV-2/FLU/RSV plus  assay is intended as an aid in the diagnosis of influenza from Nasopharyngeal swab specimens and should not be used as a sole basis for treatment. Nasal washings and aspirates are unacceptable for Xpert Xpress SARS-CoV-2/FLU/RSV testing.  Fact Sheet for Patients: BloggerCourse.com  Fact Sheet for Healthcare Providers: SeriousBroker.it  This test is not yet approved or cleared by the [place] FDA and has been authorized for detection and/or diagnosis of SARS-CoV-2 by FDA under an Emergency Use Authorization (EUA). This EUA will remain in effect (meaning this test can be used) for the duration of the COVID-19 declaration under Section 564(b)(1) of the Act, 21 U.S.C. section 360bbb-3(b)(1), unless the authorization is terminated or revoked.     Resp Syncytial Virus by PCR NEGATIVE NEGATIVE Final    Comment: (NOTE) Fact Sheet for Patients: BloggerCourse.com  Fact Sheet for Healthcare Providers: SeriousBroker.it  This test is not yet  approved or cleared by the [place] FDA and has been authorized for detection and/or diagnosis of SARS-CoV-2 by FDA under an Emergency Use Authorization (EUA). This EUA will remain in effect (meaning this test can be used) for the duration of the COVID-19 declaration under Section 564(b)(1) of the Act, 21 U.S.C. section 360bbb-3(b)(1), unless the authorization is terminated or revoked.  Performed at [facility], [address] [place], [place] [address]   Respiratory (~20 pathogens) panel by PCR     Status: None   Collection Time: 01/02/23  9:56 PM   Specimen: Nasopharyngeal Swab; Respiratory  Result Value Ref Range Status   Adenovirus NOT DETECTED NOT DETECTED Final   Coronavirus 229E NOT DETECTED NOT DETECTED Final    Comment: (NOTE) The Coronavirus on the Respiratory Panel, DOES NOT test for the novel  Coronavirus (2019 nCoV)    Coronavirus HKU1 NOT DETECTED NOT DETECTED Final   Coronavirus NL63 NOT DETECTED NOT DETECTED Final   Coronavirus OC43 NOT DETECTED NOT DETECTED Final   Metapneumovirus NOT DETECTED NOT DETECTED Final   Rhinovirus / Enterovirus NOT DETECTED NOT DETECTED Final   Influenza A NOT DETECTED NOT DETECTED Final   Influenza B NOT DETECTED NOT DETECTED Final   Parainfluenza Virus 1 NOT DETECTED NOT DETECTED Final   Parainfluenza Virus 2 NOT DETECTED NOT DETECTED Final   Parainfluenza Virus 3 NOT DETECTED NOT DETECTED Final   Parainfluenza Virus 4 NOT DETECTED NOT DETECTED Final   Respiratory Syncytial Virus NOT DETECTED NOT DETECTED Final   Bordetella pertussis NOT DETECTED NOT DETECTED Final   Bordetella Parapertussis NOT DETECTED NOT DETECTED Final   Chlamydophila pneumoniae NOT DETECTED NOT DETECTED Final   Mycoplasma pneumoniae NOT DETECTED NOT DETECTED Final    Comment: Performed at [facility], [address], [place], [place] [address]  MRSA Next Gen by PCR, Nasal     Status: None   Collection Time: 01/03/23 12:43 AM    Specimen: Nasal Mucosa; Nasal Swab  Result Value Ref Range Status   MRSA by PCR Next Gen NOT DETECTED NOT DETECTED Final    Comment: (NOTE) The GeneXpert MRSA Assay (FDA approved for NASAL specimens only), is one component of a comprehensive MRSA colonization surveillance program. It is not intended to diagnose MRSA infection nor to guide or monitor treatment for MRSA infections. Test performance is not FDA approved in patients less than 34 years old. Performed at [facility], [address], [place], [place] [address]      Medications:    guaiFENesin  600 mg Oral BID  ipratropium-albuterol  3 mL Nebulization QID   levothyroxine  125 mcg Oral Q0600   methylPREDNISolone (SOLU-MEDROL) injection  40 mg Intravenous Q12H   Followed by   Melene Muller ON 01/04/2023] predniSONE  40 mg Oral Q breakfast   mouth rinse  15 mL Mouth Rinse 4 times per day   Continuous Infusions:  azithromycin Stopped (01/02/23 2118)   cefTRIAXone (ROCEPHIN)  IV Stopped (01/02/23 2047)      LOS: 1 day   Marinda Elk  [facility]  01/03/2023, 6:42 AM

## 2023-01-03 NOTE — Assessment & Plan Note (Signed)
-   Check TSH continue home medications Given elevated TSH will check t3 t4 and increased  Synthroid po q day Need to clarify in Am w family if pt has been compliant

## 2023-01-03 NOTE — Progress Notes (Signed)
At 2017, pt seen, HR 96, rr19, spo2 90% on room air.  No increased wob/respiratory distress noted or voiced by patient at this time.  Pt given her scheduled nebulizer treatment which she tolerated well.  Pt placed on 2l nasal cannula, spo2 improved to 93%.  RN notified.  Bipap remains in room on standby but not indicated at this time.

## 2023-01-03 NOTE — Assessment & Plan Note (Addendum)
Pt seems to be fluid up  Recheck in AM

## 2023-01-03 NOTE — Progress Notes (Signed)
PHARMACY - PHYSICIAN COMMUNICATION CRITICAL VALUE ALERT - BLOOD CULTURE IDENTIFICATION (BCID)  Diamond Holland is an 89 y.o. female who presented to [facility] on 01/02/2023 with a chief complaint of AMS.   Assessment:  Pt currently on antibiotics for CAP BCID + 2/4 GNR - E.coli  Name of physician (or Provider) Contacted: Dr. David Stall  Current antibiotics: Ceftriaxone 2 g IV daily + azithromycin  Changes to prescribed antibiotics recommended: Continue ceftriaxone, stop azithromycin.   Results for orders placed or performed during the hospital encounter of 01/02/23  Blood Culture ID Panel (Reflexed) (Collected: 01/02/2023  7:49 PM)  Result Value Ref Range   Enterococcus faecalis NOT DETECTED NOT DETECTED   Enterococcus Faecium NOT DETECTED NOT DETECTED   Listeria monocytogenes NOT DETECTED NOT DETECTED   Staphylococcus species NOT DETECTED NOT DETECTED   Staphylococcus aureus (BCID) NOT DETECTED NOT DETECTED   Staphylococcus epidermidis NOT DETECTED NOT DETECTED   Staphylococcus lugdunensis NOT DETECTED NOT DETECTED   Streptococcus species NOT DETECTED NOT DETECTED   Streptococcus agalactiae NOT DETECTED NOT DETECTED   Streptococcus pneumoniae NOT DETECTED NOT DETECTED   Streptococcus pyogenes NOT DETECTED NOT DETECTED   A.calcoaceticus-baumannii NOT DETECTED NOT DETECTED   Bacteroides fragilis NOT DETECTED NOT DETECTED   Enterobacterales DETECTED (A) NOT DETECTED   Enterobacter cloacae complex NOT DETECTED NOT DETECTED   Escherichia coli DETECTED (A) NOT DETECTED   Klebsiella aerogenes NOT DETECTED NOT DETECTED   Klebsiella oxytoca NOT DETECTED NOT DETECTED   Klebsiella pneumoniae NOT DETECTED NOT DETECTED   Proteus species NOT DETECTED NOT DETECTED   Salmonella species NOT DETECTED NOT DETECTED   Serratia marcescens NOT DETECTED NOT DETECTED   Haemophilus influenzae NOT DETECTED NOT DETECTED   Neisseria meningitidis NOT DETECTED NOT DETECTED   Pseudomonas aeruginosa NOT  DETECTED NOT DETECTED   Stenotrophomonas maltophilia NOT DETECTED NOT DETECTED   Candida albicans NOT DETECTED NOT DETECTED   Candida auris NOT DETECTED NOT DETECTED   Candida glabrata NOT DETECTED NOT DETECTED   Candida krusei NOT DETECTED NOT DETECTED   Candida parapsilosis NOT DETECTED NOT DETECTED   Candida tropicalis NOT DETECTED NOT DETECTED   Cryptococcus neoformans/gattii NOT DETECTED NOT DETECTED   CTX-M ESBL NOT DETECTED NOT DETECTED   Carbapenem resistance IMP NOT DETECTED NOT DETECTED   Carbapenem resistance KPC NOT DETECTED NOT DETECTED   Carbapenem resistance NDM NOT DETECTED NOT DETECTED   Carbapenem resist OXA 48 LIKE NOT DETECTED NOT DETECTED   Carbapenem resistance VIM NOT DETECTED NOT DETECTED    Cindi Carbon, PharmD 01/03/2023  1:45 PM

## 2023-01-03 NOTE — Consult Note (Signed)
Consultation Note Date: 01/03/2023   Patient Name: Diamond Holland  DOB: Nov 03, 1926  MRN: [id number]  Age / Sex: 89 y.o., female  PCP: Ralene Ok, MD Referring Physician: Marinda Elk, MD  Reason for Consultation: Establishing goals of care  HPI/Patient Profile: 89 y.o. female   admitted on 01/02/2023    Clinical Assessment and Goals of Care: 89 year old lady with hypertension COPD admitted with confusion and fever generalized fatigue, admitted to hospital medicine service for acute respiratory failure with hypoxia deemed secondary to community-acquired pneumonia.  Also found to have severe sepsis with acute kidney injury. Has underlying history of COPD.  Started on antibiotics.  Initially required BiPAP.  Surface echocardiogram also done.  Close monitoring in stepdown unit being done. Palliative consult for goals of care discussions has been requested. Patient seen and examined.  Chart reviewed.  Discussed with patient.  Call placed and discussed with daughter as well.  Patient lives with her daughter Gavin Pound for the last 3 years.  Discussed about scope of current hospitalization.  Goals wishes and values attempted to be explored. Palliative medicine is specialized medical care for people living with serious illness. It focuses on providing relief from the symptoms and stress of a serious illness. The goal is to improve quality of life for both the patient and the family. Goals of care: Broad aims of medical therapy in relation to the patient's values and preferences. Our aim is to provide medical care aimed at enabling patients to achieve the goals that matter most to them, given the circumstances of their particular medical situation and their constraints.   NEXT OF KIN Patient lives at home with daughter Gavin Pound  SUMMARY OF RECOMMENDATIONS   Agree with DNR Continue current mode of care Monitor  hospital course, O2 requirements, p.o. intake.  SLP note reviewed. Goals of care discussions undertaken with patient's daughter Gavin Pound with whom the patient lives and who is the patient's primary caregiver: Gavin Pound wants the patient to come back home towards the end of this hospitalization.  She does not want to consider SNF.  She is accepting of home-based health care, home-based physical therapy.  Recommend addition of home-based palliative services. Thank you for the consult.  Code Status/Advance Care Planning: DNR   Symptom Management:     Palliative Prophylaxis:  Delirium Protocol  Additional Recommendations (Limitations, Scope, Preferences): Full Scope Treatment  Psycho-social/Spiritual:  Desire for further Chaplaincy support:yes Additional Recommendations: Caregiving  Support/Resources  Prognosis:  Unable to determine  Discharge Planning: Home with Home Health      Primary Diagnoses: Present on Admission:  AKI (acute kidney injury) (HCC)  CAP (community acquired pneumonia)  Sepsis (HCC)  COPD mixed type (HCC)  Acute respiratory failure with hypoxia (HCC)  COPD with acute exacerbation (HCC)  Hypothyroidism  Elevated CK  UTI (urinary tract infection)   I have reviewed the medical record, interviewed the patient and family, and examined the patient. The following aspects are pertinent.  Past Medical History:  Diagnosis Date   Allergy  Arthritis    Asthma    COPD (chronic obstructive pulmonary disease) (HCC)    Diabetes mellitus without complication (HCC)    Emphysema of lung (HCC)    Osteoporosis    Thyroid disease    Social History   Socioeconomic History   Marital status: Widowed    Spouse name: Not on file   Number of children: Not on file   Years of education: Not on file   Highest education level: Not on file  Occupational History   Not on file  Tobacco Use   Smoking status: Former   Smokeless tobacco: Not on file  Substance and Sexual  Activity   Alcohol use: Not on file   Drug use: Not on file   Sexual activity: Not on file  Other Topics Concern   Not on file  Social History Narrative   Not on file   Social Determinants of Health   Financial Resource Strain: Not on file  Food Insecurity: Not on file  Transportation Needs: Not on file  Physical Activity: Not on file  Stress: Not on file  Social Connections: Not on file   Family History  Problem Relation Age of Onset   Cancer Mother    Heart disease Mother    Cancer Father    Heart disease Father    Cancer Brother    Breast cancer Daughter    Breast cancer Maternal Grandmother    Scheduled Meds:  Chlorhexidine Gluconate Cloth  6 each Topical Daily   guaiFENesin  600 mg Oral BID   ipratropium-albuterol  3 mL Nebulization QID   levothyroxine  125 mcg Oral Q0600   mouth rinse  15 mL Mouth Rinse 4 times per day   predniSONE  10 mg Oral Q breakfast   Continuous Infusions:  sodium chloride 75 mL/hr at 01/03/23 1447   cefTRIAXone (ROCEPHIN)  IV Stopped (01/02/23 2047)   PRN Meds:.acetaminophen **OR** acetaminophen, albuterol, HYDROcodone-acetaminophen, ondansetron **OR** ondansetron (ZOFRAN) IV, mouth rinse Medications Prior to Admission:  Prior to Admission medications   Medication Sig Start Date End Date Taking? Authorizing Provider  aspirin EC 81 MG tablet Take 81 mg by mouth daily. Swallow whole.   Yes [provider]  Calcium Citrate-Vitamin D (CALCIUM CITRATE + D PO) Take 1 tablet by mouth daily.   Yes [provider]  diphenhydrAMINE (BENADRYL) 25 mg capsule Take 25 mg by mouth every 6 (six) hours as needed for itching, allergies or sleep.   Yes [provider]  Glucosamine-Chondroitin-MSM 375-300-237.5 MG CAPS Take 1 capsule by mouth daily.   Yes [provider]  LIVALO 2 MG TABS Take 2 mg by mouth in the morning.   Yes [provider]  losartan (COZAAR) 50 MG tablet Take 50 mg by mouth at bedtime.   Yes  [provider]  Multiple Vitamins-Minerals (MULTI FOR HER 50+) TABS Take 1 tablet by mouth daily with breakfast.   Yes [provider]  Omega-3 Fatty Acids (OMEGA 3 500 PO) Take 500 mg by mouth daily.   Yes [provider]  pantoprazole (PROTONIX) 20 MG tablet Take 20 mg by mouth daily before breakfast.   Yes [provider]  Phenylephrine-DM-GG-APAP (SEVERE COLD & FLU PO) Take 1 capsule by mouth every 4 (four) hours as needed (for COPD symptoms).   Yes [provider]  SYNTHROID 100 MCG tablet Take 100 mcg by mouth daily before breakfast.   Yes [provider]  triamterene-hydrochlorothiazide (DYAZIDE) 37.5-25 MG per capsule  Take 1 capsule by mouth in the morning.   Yes [provider]  albuterol (PROVENTIL HFA;VENTOLIN HFA) 108 (90 BASE) MCG/ACT inhaler Inhale 2 puffs into the lungs every 4 (four) hours as needed for wheezing or shortness of breath (cough, shortness of breath or wheezing.). Patient not taking: Reported on 01/02/2023 05/31/15   [name], MD  amoxicillin (AMOXIL) 875 MG tablet Take 1 tablet (875 mg total) by mouth 2 (two) times daily. Patient not taking: Reported on 01/02/2023 05/31/15   [name], MD  predniSONE (DELTASONE) 20 MG tablet Take 2 pills daily for 3 days, then 1 daily for 3 day, then 1/2 daily for 4 days Patient not taking: Reported on 01/02/2023 05/31/15   [name], MD   Allergies  Allergen Reactions   Tape Other (See Comments)    SKIN IS VERY DELICATE AND WILL TEAR AND BRUISE EASILY!!!!   Codeine Other (See Comments)    Stomach pain   Review of Systems Weakness, shortness of breath is getting better Physical Exam Monitor noted No acute distress Feels like she has to have a bowel movement Regular work of breathing S1-S2 No edema Answers questions appropriately awake alert oriented  Vital Signs: BP (!) 128/57   Pulse 100   Temp 97.9 F (36.6 C) (Axillary)   Resp (!) 21   Ht  5\' 6"  (1.676 m)   Wt 54.8 kg   SpO2 91%   BMI 19.50 kg/m  Pain Scale: 0-10   Pain Score: Asleep   SpO2: SpO2: 91 % O2 Device:SpO2: 91 % O2 Flow Rate: .O2 Flow Rate (L/min): 2 L/min  IO: Intake/output summary:  Intake/Output Summary (Last 24 hours) at 01/03/2023 1500 Last data filed at 01/03/2023 1447 Gross per 24 hour  Intake 4709.11 ml  Output 400 ml  Net 4309.11 ml    LBM: Last BM Date : 01/03/23 Baseline Weight: Weight: 54.8 kg Most recent weight: Weight: 54.8 kg     Palliative Assessment/Data:   Palliative performance scale 60%  Time In: 1400 Time Out: 1500 Time Total: 60 Greater than 50%  of this time was spent counseling and coordinating care related to the above assessment and plan.  Signed by: [name], MD   Please contact Palliative Medicine Team phone at [phone] for questions and concerns.  For individual provider: See Loretha Stapler

## 2023-01-03 NOTE — Evaluation (Signed)
Clinical/Bedside Swallow Evaluation Patient Details  Name: Diamond Holland MRN: [id number] Date of Birth: 10/11/1926  Today's Date: 01/03/2023 Time: SLP Start Time (ACUTE ONLY): 0945 SLP Stop Time (ACUTE ONLY): 1025 SLP Time Calculation (min) (ACUTE ONLY): 40 min  Past Medical History:  Past Medical History:  Diagnosis Date   Allergy    Arthritis    Asthma    COPD (chronic obstructive pulmonary disease) (HCC)    Diabetes mellitus without complication (HCC)    Emphysema of lung (HCC)    Osteoporosis    Thyroid disease    Past Surgical History:  Past Surgical History:  Procedure Laterality Date   ABDOMINAL HYSTERECTOMY     BREAST BIOPSY Right pt unsure   benign   CHOLECYSTECTOMY     HPI: : Pt is a 89 yo female adm to [facility] with resp failure, copd, on bipap and then transitioned to nasal cannula. Per MD note, she was not fluid overloaded. Swallow eval ordered. Prior evaluation - 2002 DG esophagus SMALL SLIDING HIATUS HERNIA BUT THERE IS CONSIDERABLE GASTROESOPHAGEAL REFLUX WITH AT  LEAST AN ELEMENT OF STRICTURE SINCE A 12 MM BARIUM TABLET DOES NOT READILY TRAVERSE THE GE JUNCTION. CXR 6/11 Increasing hazy lung base opacities may represent developing/increasing effusions. Patchy bibasilar airspace disease. Daughter present and reports pt takes a reflux medication and denies dysphagia.   Pt greeted sitting upright in bed, alert and cooperative. Able to brush her tongue, swish and expectorate with water and feed herself during session.        Assessment / Plan / Recommendation  Clinical Impression  Pt with minimal oral deficits due to xerostomia with dentures- resulting in minimal retention that cleared with applesauce swallow. She did not demonstrate any clinical indication of significant dysphagia with minimal po observed *(She declined to consume more than few bites/sips due to being tired.  Daughter present and reports pt eats small frequent amounts at home and does not have dysphagia.  Pt able to self feed graham cracker, applesauce, cranberry juice and water. She touched her mid chest with cranberry juice swallow - advising to discomfort - causing SLP to question spasm given h/o GERD. Pt did not report discomfort with all further trials.  She has a known h/o GERD - and pt with frequent eructation during session. Recommend reg/thin to allow family to order foods best tolerated for pt encouraging soft foods.  No SLP follow up indicated. SLP Visit Diagnosis: Dysphagia, unspecified (R13.10)    Aspiration Risk  Mild aspiration risk    Diet Recommendation Regular;Thin liquid   Liquid Administration via: Cup;Straw Medication Administration: Other (Comment) (as tolerated) Supervision: Patient able to self feed Compensations: Slow rate;Small sips/bites Postural Changes: Seated upright at 90 degrees;Remain upright for at least 30 minutes after po intake    Other  Recommendations Oral Care Recommendations: Oral care BID    Recommendations for follow up therapy are one component of a multi-disciplinary discharge planning process, led by the attending physician.  Recommendations may be updated based on patient status, additional functional criteria and insurance authorization.  Follow up Recommendations No SLP follow up      Assistance Recommended at Discharge  N/a  Functional Status Assessment Patient has not had a recent decline in their functional status  Frequency and Duration     N/a       Prognosis   N/a     Swallow Study   General   Pt is a 89 yo female adm to [facility] with resp failure,  copd, on bipap and then transitioned to nasal cannula. Per MD note, she was not fluid overloaded. Swallow eval ordered. Prior evaluation - 2002 DG esophagus SMALL SLIDING HIATUS HERNIA BUT THERE IS CONSIDERABLE GASTROESOPHAGEAL REFLUX WITH AT  LEAST AN ELEMENT OF STRICTURE SINCE A 12 MM BARIUM TABLET DOES NOT READILY TRAVERSE THE GE JUNCTION. CXR 6/11 Increasing hazy lung base opacities may  represent developing/increasing effusions. Patchy bibasilar airspace disease. Daughter present and reports pt takes a reflux medication and denies dysphagia.   Pt greeted sitting upright in bed, alert and cooperative. Able to brush her tongue, swish and expectorate with water and feed herself during session.     Oral/Motor/Sensory Function Overall Oral Motor/Sensory Function: Within functional limits   Ice Chips Ice chips: Not tested   Thin Liquid Thin Liquid: Within functional limits Presentation: Cup;Self Fed    Nectar Thick Nectar Thick Liquid: Not tested   Honey Thick Honey Thick Liquid: Not tested   Puree Puree: Within functional limits Presentation: Self Fed;Spoon   Solid     Solid: Impaired Presentation: Self Fed Oral Phase Impairments: Impaired mastication Oral Phase Functional Implications: Oral residue;Prolonged oral transit      Chales Abrahams 01/03/2023,12:00 PM  Rolena Infante, MS [facility] SLP Acute Rehab Services Office [phone]

## 2023-01-04 DIAGNOSIS — J441 Chronic obstructive pulmonary disease with (acute) exacerbation: Secondary | ICD-10-CM | POA: Diagnosis not present

## 2023-01-04 DIAGNOSIS — J189 Pneumonia, unspecified organism: Secondary | ICD-10-CM | POA: Diagnosis not present

## 2023-01-04 DIAGNOSIS — N179 Acute kidney failure, unspecified: Secondary | ICD-10-CM | POA: Diagnosis not present

## 2023-01-04 DIAGNOSIS — R7989 Other specified abnormal findings of blood chemistry: Secondary | ICD-10-CM | POA: Diagnosis not present

## 2023-01-04 LAB — BASIC METABOLIC PANEL
Anion gap: 11 (ref 5–15)
BUN: 37 mg/dL — ABNORMAL HIGH (ref 8–23)
CO2: 24 mmol/L (ref 22–32)
Calcium: 8.3 mg/dL — ABNORMAL LOW (ref 8.9–10.3)
Chloride: 100 mmol/L (ref 98–111)
Creatinine, Ser: 1.28 mg/dL — ABNORMAL HIGH (ref 0.44–1.00)
GFR, Estimated: 39 mL/min — ABNORMAL LOW (ref >60)
Glucose, Bld: 120 mg/dL — ABNORMAL HIGH (ref 70–99)
Potassium: 2.9 mmol/L — ABNORMAL LOW (ref 3.5–5.1)
Sodium: 135 mmol/L (ref 135–145)

## 2023-01-04 LAB — GLUCOSE, CAPILLARY
Glucose-Capillary: 108 mg/dL — ABNORMAL HIGH (ref 70–99)
Glucose-Capillary: 110 mg/dL — ABNORMAL HIGH (ref 70–99)
Glucose-Capillary: 134 mg/dL — ABNORMAL HIGH (ref 70–99)

## 2023-01-04 LAB — URINE CULTURE: Culture: 50000 — AB

## 2023-01-04 LAB — CK: Total CK: 370 U/L — ABNORMAL HIGH (ref 38–234)

## 2023-01-04 LAB — T3: T3, Total: 27 ng/dL — ABNORMAL LOW (ref 71–180)

## 2023-01-04 MED ORDER — POTASSIUM CHLORIDE CRYS ER 20 MEQ PO TBCR
40.0000 meq | EXTENDED_RELEASE_TABLET | Freq: Two times a day (BID) | ORAL | Status: AC
  Administered 2023-01-04 (×2): 40 meq via ORAL
  Filled 2023-01-04 (×2): qty 2

## 2023-01-04 MED ORDER — POTASSIUM CHLORIDE CRYS ER 20 MEQ PO TBCR
40.0000 meq | EXTENDED_RELEASE_TABLET | Freq: Once | ORAL | Status: AC
  Administered 2023-01-04: 40 meq via ORAL
  Filled 2023-01-04: qty 2

## 2023-01-04 MED ORDER — ALUM & MAG HYDROXIDE-SIMETH 200-200-20 MG/5ML PO SUSP
15.0000 mL | Freq: Four times a day (QID) | ORAL | Status: DC | PRN
  Administered 2023-01-04 – 2023-01-06 (×3): 15 mL via ORAL
  Filled 2023-01-04 (×3): qty 30

## 2023-01-04 MED ORDER — IPRATROPIUM-ALBUTEROL 0.5-2.5 (3) MG/3ML IN SOLN
3.0000 mL | Freq: Three times a day (TID) | RESPIRATORY_TRACT | Status: DC
  Administered 2023-01-04 – 2023-01-06 (×4): 3 mL via RESPIRATORY_TRACT
  Filled 2023-01-04 (×5): qty 3

## 2023-01-04 MED ORDER — POTASSIUM CHLORIDE CRYS ER 20 MEQ PO TBCR: 20.0000 meq | EXTENDED_RELEASE_TABLET | Freq: Once | ORAL | Status: DC

## 2023-01-04 NOTE — Progress Notes (Signed)
Daily Progress Note   Patient Name: Diamond Holland       Date: 01/04/2023 DOB: 04-Mar-1927  Age: 89 y.o. MRN#: [id number] Attending Physician: Marinda Elk, MD Primary Care Physician: Ralene Ok, MD Admit Date: 01/02/2023  Reason for Consultation/Follow-up: Establishing goals of care  Subjective: Awake alert, sitting up in a chair.  It is the patient's birthday today.  States that the work of breathing is improving.  In no distress.  Daughter at bedside.  Length of Stay: 2  Current Medications: Scheduled Meds:   Chlorhexidine Gluconate Cloth  6 each Topical Daily   guaiFENesin  600 mg Oral BID   ipratropium-albuterol  3 mL Nebulization QID   levothyroxine  125 mcg Oral Q0600   mouth rinse  15 mL Mouth Rinse 4 times per day   potassium chloride  40 mEq Oral BID    Continuous Infusions:  cefTRIAXone (ROCEPHIN)  IV Stopped (01/03/23 2055)    PRN Meds: acetaminophen **OR** acetaminophen, albuterol, HYDROcodone-acetaminophen, ondansetron **OR** ondansetron (ZOFRAN) IV, mouth rinse  Physical Exam         Revealed lady sitting in a chair No acute distress Regular work of breathing S1-S2 Monitor noted No edema  Vital Signs: BP (!) 142/75   Pulse 85   Temp (!) 97.5 F (36.4 C) (Oral) Comment: RN present  Resp 18   Ht 5\' 6"  (1.676 m)   Wt 54.8 kg   SpO2 100%   BMI 19.50 kg/m  SpO2: SpO2: 100 % O2 Device: O2 Device: Nasal Cannula O2 Flow Rate: O2 Flow Rate (L/min): 2 L/min  Intake/output summary:  Intake/Output Summary (Last 24 hours) at 01/04/2023 1159 Last data filed at 01/04/2023 0849 Gross per 24 hour  Intake 1844.52 ml  Output 200 ml  Net 1644.52 ml   LBM: Last BM Date : 01/03/23 Baseline Weight: Weight: 54.8 kg Most recent weight: Weight: 54.8 kg        Palliative Assessment/Data:      Patient Active Problem List   Diagnosis Date Noted   Hypothyroidism 01/03/2023   Elevated CK 01/03/2023   UTI (urinary tract infection) 01/03/2023   AKI (acute kidney injury) (HCC) 01/02/2023   CAP (community acquired pneumonia) 01/02/2023   Sepsis (HCC) 01/02/2023   COPD mixed type (HCC) 01/02/2023   Acute respiratory failure with hypoxia (  HCC) 01/02/2023   COPD with acute exacerbation (HCC) 01/02/2023    Palliative Care Assessment & Plan   Patient Profile:    Assessment: 89 year old lady who lives at home with daughter with history of hypertension COPD admitted with confusion and fever, found to have acute hypoxic respiratory failure secondary to community-acquired pneumonia sepsis and acute kidney injury at time of presentation.  Has underlying history of COPD. Palliative consult for goals of care discussions.  Recommendations/Plan: Goals of care discussions with patient's daughter who was present at bedside.  Also discussed with patient.  Both patient and daughter would like for the patient to go home and continue to live with her daughter towards the end of this hospitalization.  They are accepting of home health care, do not feel the need for having home-based palliative services.  Patient's daughter states that her daughter-in-law is an Charity fundraiser and lives nearby.  Patient likely needs supplemental oxygen via nasal cannula at home.  Do not want skilled nursing facility for rehabilitation attempt.  Goals of Care and Additional Recommendations: Limitations on Scope of Treatment: Full Scope Treatment  Code Status:    Code Status Orders  (From admission, onward)           Start     Ordered   01/02/23 2234  Do not attempt resuscitation (DNR)  Continuous       Question Answer Comment  If patient has no pulse and is not breathing Do Not Attempt Resuscitation   If patient has a pulse and/or is breathing: Medical Treatment Goals LIMITED  ADDITIONAL INTERVENTIONS: Use medication/IV fluids and cardiac monitoring as indicated; Do not use intubation or mechanical ventilation (DNI), also provide comfort medications.  Transfer to Progressive/Stepdown as indicated, avoid Intensive Care.   Consent: Discussion documented in EHR or advanced directives reviewed      01/02/23 2235           Code Status History     This patient has a current code status but no historical code status.      Advance Directive Documentation    Flowsheet Row Most Recent Value  Type of Advance Directive Healthcare Power of Attorney, Living will  Pre-existing out of facility DNR order (yellow form or pink MOST form) --  "MOST" Form in Place? --       Prognosis:  Unable to determine  Discharge Planning: Home with Home Health  Care plan was discussed with patient and daughter Ms. Bennie Pierini who was at the bedside.   Thank you for allowing the Palliative Medicine Team to assist in the care of this patient.  Mod MDM    Greater than 50%  of this time was spent counseling and coordinating care related to the above assessment and plan.  [name], MD  Please contact Palliative Medicine Team phone at [phone] for questions and concerns.

## 2023-01-04 NOTE — Progress Notes (Signed)
PT Cancellation Note  Patient Details Name: Diamond Holland MRN: [id number] DOB: Jul 29, 1926   Cancelled Treatment:    Reason Eval/Treat Not Completed: Other (comment) Pt up in recliner and sleeping on arrival to room.  Family present reports pt just now resting and requests not to wake her.  Will check back as schedule permits.   Kati L Payson 01/04/2023, 11:09 AM Paulino Door, DPT Physical Therapist Acute Rehabilitation Services Office: [phone]

## 2023-01-04 NOTE — Evaluation (Signed)
Physical Therapy Evaluation Patient Details Name: Diamond Holland MRN: [id number] DOB: Nov 25, 1926 Today's Date: 01/04/2023  History of Present Illness  Pt is a 89 year old female admitted 01/02/23 for acute respiratory failure with hypoxia secondary to community-acquired pneumonia.  PMHx: emphysema, DM, asthma, allergies, COPD  Clinical Impression  Pt admitted with above diagnosis.  Pt currently with functional limitations due to the deficits listed below (see PT Problem List). Pt will benefit from acute skilled PT to increase their independence and safety with mobility to allow discharge.  Pt requesting to return to bed upon arrival to room.  Pt then had urinary urgency and requested bathroom prior to going to bed.  Pt assisted with ambulating to/from bathroom and back to bed.  Pt did not have on O2 [place] for mobility and SPO2 dropped to 82% on room air however poor waveform observed and difficultly with reading most of the time (RN informed).  Reapplied 2L O2 [place] upon pt return to bed.  Multiple family members of pt into room end of session with birthday treats so therapist did not obtain DME at home however per notes, pt plans to return home with her daughter upon d/c.        Recommendations for follow up therapy are one component of a multi-disciplinary discharge planning process, led by the attending physician.  Recommendations may be updated based on patient status, additional functional criteria and insurance authorization.  Follow Up Recommendations       Assistance Recommended at Discharge Set up Supervision/Assistance  Patient can return home with the following  A little help with walking and/or transfers;A little help with bathing/dressing/bathroom;Assistance with cooking/housework;Assist for transportation;Help with stairs or ramp for entrance    Equipment Recommendations Other (comment) (pending DME at home and progress)  Recommendations for Other Services       Functional Status  Assessment Patient has had a recent decline in their functional status and demonstrates the ability to make significant improvements in function in a reasonable and predictable amount of time.     Precautions / Restrictions Precautions Precautions: Fall Precaution Comments: monitor sats Restrictions Weight Bearing Restrictions: No      Mobility  Bed Mobility Overal bed mobility: Needs Assistance Bed Mobility: Sit to Supine       Sit to supine: Supervision        Transfers Overall transfer level: Needs assistance Equipment used: 1 person hand held assist Transfers: Sit to/from Stand Sit to Stand: Min assist           General transfer comment: assist to rise from toilet    Ambulation/Gait Ambulation/Gait assistance: Min guard Gait Distance (Feet): 8 Feet (x2) Assistive device: 1 person hand held assist Gait Pattern/deviations: Step-through pattern, Decreased stride length       General Gait Details: pt reports urinary urgency, provided HHA and pt utilized furniture/walls with other hand, removed O2, SpO2 with poor waveform however reading 82% on room air upon return to bed so reapplied O2 [place] and RN notified.  Stairs            Wheelchair Mobility    Modified Rankin (Stroke Patients Only)       Balance Overall balance assessment: Mild deficits observed, not formally tested                                           Pertinent Vitals/Pain Pain  Assessment Pain Assessment: No/denies pain    Home Living Family/patient expects to be discharged to:: Private residence Living Arrangements: Children (daughter)                      Prior Function Prior Level of Function : Independent/Modified Independent                     Higher education careers adviser        Extremity/Trunk Assessment        Lower Extremity Assessment Lower Extremity Assessment: Generalized weakness    Cervical / Trunk Assessment Cervical / Trunk  Assessment: Kyphotic  Communication   Communication: HOH  Cognition Arousal/Alertness: Awake/alert Behavior During Therapy: WFL for tasks assessed/performed Overall Cognitive Status: Within Functional Limits for tasks assessed                                          General Comments      Exercises     Assessment/Plan    PT Assessment Patient needs continued PT services  PT Problem List Decreased mobility;Decreased activity tolerance;Decreased strength;Cardiopulmonary status limiting activity;Decreased knowledge of use of DME       PT Treatment Interventions DME instruction;Therapeutic exercise;Gait training;Functional mobility training;Therapeutic activities;Patient/family education;Balance training    PT Goals (Current goals can be found in the Care Plan section)  Acute Rehab PT Goals PT Goal Formulation: With patient Time For Goal Achievement: 01/18/23 Potential to Achieve Goals: Good    Frequency Min 1X/week     Co-evaluation               AM-PAC PT "6 Clicks" Mobility  Outcome Measure Help needed turning from your back to your side while in a flat bed without using bedrails?: A Little Help needed moving from lying on your back to sitting on the side of a flat bed without using bedrails?: A Little Help needed moving to and from a bed to a chair (including a wheelchair)?: A Little Help needed standing up from a chair using your arms (e.g., wheelchair or bedside chair)?: A Little Help needed to walk in hospital room?: A Little Help needed climbing 3-5 steps with a railing? : A Little 6 Click Score: 18    End of Session Equipment Utilized During Treatment: Gait belt Activity Tolerance: Patient tolerated treatment well Patient left: with call bell/phone within reach;in bed;with bed alarm set;with family/visitor present Nurse Communication: Mobility status PT Visit Diagnosis: Difficulty in walking, not elsewhere classified (R26.2)    Time:  1610-9604 PT Time Calculation (min) (ACUTE ONLY): 17 min   Charges:   PT Evaluation $PT Eval Low Complexity: 1 Low         Kati PT, DPT Physical Therapist Acute Rehabilitation Services Office: [phone]   Janan Halter [place] 01/04/2023, 3:01 PM

## 2023-01-04 NOTE — Progress Notes (Signed)
[facility] PROGRESS NOTE    Progress Note  Diamond Holland  ZOX:[id number] DOB: September 09, 1926 DOA: 01/02/2023 PCP: Ralene Ok, MD     Brief Narrative:   Diamond Holland is an 89 y.o. female past medical history significant for essential hypertension COPD comes in with confusion and fever as per family members as the patient cannot provide history generalized fatigue confusion that started 2 days prior to arrival started on BiPAP upon arrival to the ED  Assessment/Plan:   Acute respiratory failure with hypoxia secondary to community-acquired pneumonia: Has been in to room air. Continue IV empiric Rocephin and azithromycin blood cultures have been negative till date. Started empirically on antibiotics Rocephin and azithromycin. 2D echo showed preserved EF with grade 2 diastolic dysfunction. Appears euvolemic on physical exam KVO IV fluids. Discontinue telemetry, transferred to MedSurg. PT OT has been consulted  Severe sepsis: With acute kidney injury and confusion. Now solved KVO IV fluids continued.  Antibiotics. UTI has been ruled out.  Acute kidney injury: Unknown baseline. Admission to 2.3, improved with IV fluids, this morning creatinine 1.2. ACE inhibitor has been held. Strict I's and O's.    Hypokalemia: Replete orally recheck in the morning.  COPD with acute exacerbation (HCC) Probably contributing continue current home meds inhalers. Continue albuterol scheduled. Currently on BiPAP. Continue inhalers antibiotics and steroids. She is currently not wheezing, will taper off antibiotic course quickly.  Hypothyroidism: Continue Synthroid.  Nontraumatic rhabdomyolysis: Improving with IV fluids.  Hypovolemic hyponatremia: Continue IV fluids.   DVT prophylaxis: lovenox Family Communication:family Status is: Inpatient Remains inpatient appropriate because: Acute respiratory failure with hypoxia    Code Status:     Code Status Orders  (From  admission, onward)           Start     Ordered   01/02/23 2234  Do not attempt resuscitation (DNR)  Continuous       Question Answer Comment  If patient has no pulse and is not breathing Do Not Attempt Resuscitation   If patient has a pulse and/or is breathing: Medical Treatment Goals LIMITED ADDITIONAL INTERVENTIONS: Use medication/IV fluids and cardiac monitoring as indicated; Do not use intubation or mechanical ventilation (DNI), also provide comfort medications.  Transfer to Progressive/Stepdown as indicated, avoid Intensive Care.   Consent: Discussion documented in EHR or advanced directives reviewed      01/02/23 2235           Code Status History     This patient has a current code status but no historical code status.         IV Access:   Peripheral IV   Procedures and diagnostic studies:   ECHOCARDIOGRAM COMPLETE  Result Date: 01/03/2023    ECHOCARDIOGRAM REPORT   Patient Name:   Diamond Holland Date of Exam: 01/03/2023 Medical Rec #:  [id number]       Height:       66.0 in Accession #:    [id number]      Weight:       120.8 lb Date of Birth:  12/12/1926       BSA:          1.614 m Patient Age:    89 years        BP:           139/64 mmHg Patient Gender: F               HR:  95 bpm. Exam Location:  Inpatient Procedure: 2D Echo, Cardiac Doppler and Color Doppler Indications:    Dyspnea R06.00  History:        Patient has no prior history of Echocardiogram examinations.                 COPD; Risk Factors:Diabetes.  Sonographer:    Lucendia Herrlich Referring Phys: [id number] ANASTASSIA DOUTOVA IMPRESSIONS  1. Left ventricular ejection fraction, by estimation, is 55 to 60%. The left ventricle has normal function. The left ventricle has no regional wall motion abnormalities. There is mild concentric left ventricular hypertrophy. Left ventricular diastolic parameters are consistent with Grade II diastolic dysfunction (pseudonormalization).  2. Right ventricular systolic  function is normal. The right ventricular size is moderately enlarged.  3. Left atrial size was moderately dilated.  4. Prominent Crista terminalis. Right atrial size was moderately dilated.  5. The mitral valve is normal in structure. Trivial mitral valve regurgitation. No evidence of mitral stenosis.  6. Tricuspid valve regurgitation is moderate.  7. The aortic valve is abnormal. Aortic valve regurgitation is not visualized. Aortic valve sclerosis/calcification is present, without any evidence of aortic stenosis.  8. The inferior vena cava is normal in size with greater than 50% respiratory variability, suggesting right atrial pressure of 3 mmHg. Comparison(s): No prior Echocardiogram. FINDINGS  Left Ventricle: Left ventricular ejection fraction, by estimation, is 55 to 60%. The left ventricle has normal function. The left ventricle has no regional wall motion abnormalities. The left ventricular internal cavity size was small. There is mild concentric left ventricular hypertrophy. Left ventricular diastolic parameters are consistent with Grade II diastolic dysfunction (pseudonormalization). Right Ventricle: The right ventricular size is moderately enlarged. No increase in right ventricular wall thickness. Right ventricular systolic function is normal. Left Atrium: Left atrial size was moderately dilated. Right Atrium: Prominent Crista terminalis. Right atrial size was moderately dilated. Prominent Crista terminalis. Pericardium: There is no evidence of pericardial effusion. Mitral Valve: The mitral valve is normal in structure. Trivial mitral valve regurgitation. No evidence of mitral valve stenosis. Tricuspid Valve: The tricuspid valve is normal in structure. Tricuspid valve regurgitation is moderate . No evidence of tricuspid stenosis. Aortic Valve: Functional Biscuspid with mildly reduced LV stroke volume index but with no significant LVOT gradient. The aortic valve is abnormal. Aortic valve regurgitation is  not visualized. Aortic valve sclerosis/calcification is present, without any evidence of aortic stenosis. Aortic valve peak gradient measures 17.6 mmHg. Pulmonic Valve: The pulmonic valve was normal in structure. Pulmonic valve regurgitation is not visualized. No evidence of pulmonic stenosis. Aorta: The aortic root and ascending aorta are structurally normal, with no evidence of dilitation. Venous: The inferior vena cava is normal in size with greater than 50% respiratory variability, suggesting right atrial pressure of 3 mmHg. IAS/Shunts: The interatrial septum appears to be lipomatous. No atrial level shunt detected by color flow Doppler.  LEFT VENTRICLE PLAX 2D LVIDd:         3.30 cm   Diastology LVIDs:         2.30 cm   LV e' medial:    3.81 cm/s LV PW:         1.20 cm   LV E/e' medial:  24.1 LV IVS:        1.10 cm   LV e' lateral:   6.96 cm/s LVOT diam:     2.00 cm   LV E/e' lateral: 13.2 LV SV:         55 LV SV  Index:   34 LVOT Area:     3.14 cm  RIGHT VENTRICLE             IVC RV S prime:     12.40 cm/s  IVC diam: 1.70 cm TAPSE (M-mode): 2.0 cm LEFT ATRIUM             Index        RIGHT ATRIUM           Index LA diam:        3.50 cm 2.17 cm/m   RA Area:     18.35 cm LA Vol (A2C):   27.6 ml 17.10 ml/m  RA Volume:   43.70 ml  27.07 ml/m LA Vol (A4C):   31.0 ml 19.21 ml/m LA Biplane Vol: 31.6 ml 19.58 ml/m  AORTIC VALVE AV Area (Vmax): 1.42 cm AV Vmax:        210.00 cm/s AV Peak Grad:   17.6 mmHg LVOT Vmax:      95.00 cm/s LVOT Vmean:     63.500 cm/s LVOT VTI:       0.176 m  AORTA Ao Root diam: 2.90 cm Ao Asc diam:  2.90 cm MITRAL VALVE                TRICUSPID VALVE MV Area (PHT): 3.65 cm     TR Peak grad:   34.3 mmHg MV Decel Time: 208 msec     TR Vmax:        293.00 cm/s MR Peak grad: 68.6 mmHg MR Vmax:      414.00 cm/s   SHUNTS MV E velocity: 91.80 cm/s   Systemic VTI:  0.18 m MV A velocity: 122.00 cm/s  Systemic Diam: 2.00 cm MV E/A ratio:  0.75 Riley Lam MD Electronically signed by  Riley Lam MD Signature Date/Time: 01/03/2023/10:57:42 AM    Final    DG CHEST PORT 1 VIEW  Result Date: 01/02/2023 CLINICAL DATA:  Acute respiratory failure. EXAM: PORTABLE CHEST 1 VIEW COMPARISON:  3 hours ago. FINDINGS: Increasing hazy lung base opacities may represent developing/increasing effusions. Patchy bibasilar airspace disease. Stable heart size and mediastinal contours, aortic atherosclerosis. Question of nodular density in the left upper lobe again seen. No pneumothorax. IMPRESSION: 1. Increasing hazy lung base opacities may represent developing/increasing effusions. 2. Patchy bibasilar airspace disease, atelectasis versus pneumonia. 3. Question of left upper lobe nodule again seen, recommend CT. Electronically Signed   By: Narda Rutherford M.D.   On: 01/02/2023 23:07   CT HEAD WO CONTRAST ( )  Result Date: 01/02/2023 CLINICAL DATA:  Delirium EXAM: CT HEAD WITHOUT CONTRAST TECHNIQUE: Contiguous axial images were obtained from the base of the skull through the vertex without intravenous contrast. RADIATION DOSE REDUCTION: This exam was performed according to the departmental dose-optimization program which includes automated exposure control, adjustment of the mA and/or kV according to patient size and/or use of iterative reconstruction technique. COMPARISON:  None Available. FINDINGS: Brain: Diffuse cerebral atrophy. Ventricular dilatation consistent with central atrophy. Low-attenuation changes in the deep white matter consistent with small vessel ischemia. No abnormal extra-axial fluid collections. No mass effect or midline shift. Gray-white matter junctions are distinct. Basal cisterns are not effaced. No acute intracranial hemorrhage. Vascular: No hyperdense vessel or unexpected calcification. Skull: Normal. Negative for fracture or focal lesion. Sinuses/Orbits: No acute finding. Other: None. IMPRESSION: No acute intracranial abnormalities. Chronic atrophy and small vessel  ischemic changes. Electronically Signed   By: Burman Nieves M.D.   On: 01/02/2023  22:50   DG Chest Port 1 View  Result Date: 01/02/2023 CLINICAL DATA:  Sepsis. EXAM: PORTABLE CHEST 1 VIEW COMPARISON:  Radiograph 03/27/2017 FINDINGS: Heart is normal in size. Aortic tortuosity and atherosclerosis. Ill-defined opacity at the right lung base peripherally may represent airspace disease, effusion or combination there of. Question of left upper lobe nodular density. Peribronchial thickening appears chronic. No pneumothorax. IMPRESSION: 1. Ill-defined opacity at the right lung base peripherally may represent airspace disease, effusion or combination of thereof. 2. Question of left upper lobe nodular density. Chest CT is recommended for characterization. 3. Peribronchial thickening appears chronic. Electronically Signed   By: Narda Rutherford M.D.   On: 01/02/2023 20:41     Medical Consultants:   None.   Subjective:    Diamond Holland relates feels better this morning.  Objective:    Vitals:   01/04/23 0300 01/04/23 0400 01/04/23 0500 01/04/23 0600  BP: (!) 163/95 (!) 156/113 (!) 152/67 (!) 115/52  Pulse: (!) 107 99 96 85  Resp: (!) 24 (!) 25 18 16   Temp:      TempSrc:      SpO2: 96% 97% 92% 92%  Weight:      Height:       SpO2: 92 % O2 Flow Rate (L/min): 2 L/min FiO2 (%): 45 % (spo2 97% on 50% fio2, weaned down to 45%, rn aware)   Intake/Output Summary (Last 24 hours) at 01/04/2023 0701 Last data filed at 01/03/2023 1556 Gross per 24 hour  Intake 958.42 ml  Output --  Net 958.42 ml    Filed Weights   01/03/23 0011  Weight: 54.8 kg    Exam: General exam: In no acute distress. Respiratory system: Good air movement and clear to auscultation. Cardiovascular system: S1 & S2 heard, RRR. No JVD. Gastrointestinal system: Abdomen is nondistended, soft and nontender.  Extremities: No pedal edema. Skin: No rashes, lesions or ulcers Psychiatry: Judgement and insight appear  normal. Mood & affect appropriate. Data Reviewed:    Labs: Basic Metabolic Panel: Recent Labs  Lab 01/02/23 1949 01/02/23 2215 01/03/23 0217 01/04/23 0304  NA 131*  --  130* 135  K 3.6  --  3.4* 2.9*  CL 92*  --  94* 100  CO2 24  --  23 24  GLUCOSE 139*  --  151* 120*  BUN 41*  --  42* 37*  CREATININE 2.32*  --  2.03* 1.28*  CALCIUM 9.3  --  8.5* 8.3*  MG  --  1.5* 2.5*  --   PHOS  --  4.2 4.1  --     GFR Estimated Creatinine Clearance: 23.2 mL/min (A) (by C-G formula based on SCr of 1.28 mg/dL (H)). Liver Function Tests: Recent Labs  Lab 01/02/23 1949 01/03/23 0217  AST 51* 50*  ALT 26 26  ALKPHOS 51 49  BILITOT 0.5 0.7  PROT 7.0 5.4*  ALBUMIN 3.3* 2.6*    No results for input(s): "LIPASE", "AMYLASE" in the last 168 hours. Recent Labs  Lab 01/02/23 2215  AMMONIA 19    Coagulation profile Recent Labs  Lab 01/02/23 1949  INR 1.3*    COVID-19 Labs  No results for input(s): "DDIMER", "FERRITIN", "LDH", "CRP" in the last 72 hours.  Lab Results  Component Value Date   SARSCOV2NAA NEGATIVE 01/02/2023    CBC: Recent Labs  Lab 01/02/23 1949 01/03/23 0217  WBC 17.5* 12.9*  NEUTROABS 15.6*  --   HGB 13.3 11.1*  HCT 38.7 32.1*  MCV 91.9  92.0  PLT 172 144*    Cardiac Enzymes: Recent Labs  Lab 01/02/23 20-Jan-2214 01/03/23 0217 01/04/23 0304  CKTOTAL 870* 805* 370*    BNP (last 3 results) No results for input(s): "PROBNP" in the last 8760 hours. CBG: Recent Labs  Lab 01/03/23 1159 01/03/23 1637 01/03/23 1949 01/04/23 0108 01/04/23 0454  GLUCAP 181* 163* 140* 108* 110*    D-Dimer: No results for input(s): "DDIMER" in the last 72 hours. Hgb A1c: No results for input(s): "HGBA1C" in the last 72 hours. Lipid Profile: No results for input(s): "CHOL", "HDL", "LDLCALC", "TRIG", "CHOLHDL", "LDLDIRECT" in the last 72 hours. Thyroid function studies: Recent Labs    01/02/23 01/20/14  TSH 18.142*    Anemia work up: No results for input(s):  "VITAMINB12", "FOLATE", "FERRITIN", "TIBC", "IRON", "RETICCTPCT" in the last 72 hours. Sepsis Labs: Recent Labs  Lab 01/02/23 1949 01/02/23 2148 01/02/23 2214-01-20 01/03/23 0217  PROCALCITON  --   --  21.33  --   WBC 17.5*  --   --  12.9*  LATICACIDVEN 2.9* 3.9*  --  3.1*    Microbiology Recent Results (from the past 240 hour(s))  Culture, blood (Routine x 2)     Status: None (Preliminary result)   Collection Time: 01/02/23  7:49 PM   Specimen: BLOOD  Result Value Ref Range Status   Specimen Description   Final    BLOOD BLOOD LEFT FOREARM Performed at [facility], [address], [place], [place] [address]    Special Requests   Final    BOTTLES DRAWN AEROBIC AND ANAEROBIC Blood Culture adequate volume Performed at [facility], [address], [place], [place] [address]    Culture  Setup Time   Final    GRAM NEGATIVE RODS IN BOTH AEROBIC AND ANAEROBIC BOTTLES CRITICAL RESULT CALLED TO, READ BACK BY AND VERIFIED WITH: PHARMD JUSTIN L 1302 [id number] FCP Performed at [facility], [address], [place], [place] [address]    Culture GRAM NEGATIVE RODS  Final   Report Status PENDING  Incomplete  Blood Culture ID Panel (Reflexed)     Status: Abnormal   Collection Time: 01/02/23  7:49 PM  Result Value Ref Range Status   Enterococcus faecalis NOT DETECTED NOT DETECTED Final   Enterococcus Faecium NOT DETECTED NOT DETECTED Final   Listeria monocytogenes NOT DETECTED NOT DETECTED Final   Staphylococcus species NOT DETECTED NOT DETECTED Final   Staphylococcus aureus (BCID) NOT DETECTED NOT DETECTED Final   Staphylococcus epidermidis NOT DETECTED NOT DETECTED Final   Staphylococcus lugdunensis NOT DETECTED NOT DETECTED Final   Streptococcus species NOT DETECTED NOT DETECTED Final   Streptococcus agalactiae NOT DETECTED NOT DETECTED Final   Streptococcus pneumoniae NOT DETECTED NOT DETECTED Final   Streptococcus pyogenes NOT DETECTED NOT  DETECTED Final   A.calcoaceticus-baumannii NOT DETECTED NOT DETECTED Final   Bacteroides fragilis NOT DETECTED NOT DETECTED Final   Enterobacterales DETECTED (A) NOT DETECTED Final    Comment: Enterobacterales represent a large order of gram negative bacteria, not a single organism. CRITICAL RESULT CALLED TO, READ BACK BY AND VERIFIED WITH: PHARMD JUSTIN L 1302 [id number] FCP    Enterobacter cloacae complex NOT DETECTED NOT DETECTED Final   Escherichia coli DETECTED (A) NOT DETECTED Final    Comment: CRITICAL RESULT CALLED TO, READ BACK BY AND VERIFIED WITH: PHARMD JUSTIN L 1302 [id number] FCP    Klebsiella aerogenes NOT DETECTED NOT DETECTED Final   Klebsiella oxytoca NOT DETECTED NOT DETECTED Final  Klebsiella pneumoniae NOT DETECTED NOT DETECTED Final   Proteus species NOT DETECTED NOT DETECTED Final   Salmonella species NOT DETECTED NOT DETECTED Final   Serratia marcescens NOT DETECTED NOT DETECTED Final   Haemophilus influenzae NOT DETECTED NOT DETECTED Final   Neisseria meningitidis NOT DETECTED NOT DETECTED Final   Pseudomonas aeruginosa NOT DETECTED NOT DETECTED Final   Stenotrophomonas maltophilia NOT DETECTED NOT DETECTED Final   Candida albicans NOT DETECTED NOT DETECTED Final   Candida auris NOT DETECTED NOT DETECTED Final   Candida glabrata NOT DETECTED NOT DETECTED Final   Candida krusei NOT DETECTED NOT DETECTED Final   Candida parapsilosis NOT DETECTED NOT DETECTED Final   Candida tropicalis NOT DETECTED NOT DETECTED Final   Cryptococcus neoformans/gattii NOT DETECTED NOT DETECTED Final   CTX-M ESBL NOT DETECTED NOT DETECTED Final   Carbapenem resistance IMP NOT DETECTED NOT DETECTED Final   Carbapenem resistance KPC NOT DETECTED NOT DETECTED Final   Carbapenem resistance NDM NOT DETECTED NOT DETECTED Final   Carbapenem resist OXA 48 LIKE NOT DETECTED NOT DETECTED Final   Carbapenem resistance VIM NOT DETECTED NOT DETECTED Final    Comment: Performed at [facility], [address] [place], [place] [address]  Resp panel by RT-PCR (RSV, Flu A&B, Covid) Anterior Nasal Swab     Status: None   Collection Time: 01/02/23  9:56 PM   Specimen: Anterior Nasal Swab  Result Value Ref Range Status   SARS Coronavirus 2 by RT PCR NEGATIVE NEGATIVE Final    Comment: (NOTE) SARS-CoV-2 target nucleic acids are NOT DETECTED.  The SARS-CoV-2 RNA is generally detectable in upper respiratory specimens during the acute phase of infection. The lowest concentration of SARS-CoV-2 viral copies this assay can detect is 138 copies/mL. A negative result does not preclude SARS-Cov-2 infection and should not be used as the sole basis for treatment or other patient management decisions. A negative result may occur with  improper specimen collection/handling, submission of specimen other than nasopharyngeal swab, presence of viral mutation(s) within the areas targeted by this assay, and inadequate number of viral copies(<138 copies/mL). A negative result must be combined with clinical observations, patient history, and epidemiological information. The expected result is Negative.  Fact Sheet for Patients:  BloggerCourse.com  Fact Sheet for Healthcare Providers:  SeriousBroker.it  This test is no t yet approved or cleared by the [place] FDA and  has been authorized for detection and/or diagnosis of SARS-CoV-2 by FDA under an Emergency Use Authorization (EUA). This EUA will remain  in effect (meaning this test can be used) for the duration of the COVID-19 declaration under Section 564(b)(1) of the Act, 21 U.S.C.section 360bbb-3(b)(1), unless the authorization is terminated  or revoked sooner.       Influenza A by PCR NEGATIVE NEGATIVE Final   Influenza B by PCR NEGATIVE NEGATIVE Final    Comment: (NOTE) The Xpert Xpress SARS-CoV-2/FLU/RSV plus assay is intended as an aid in the diagnosis of influenza from  Nasopharyngeal swab specimens and should not be used as a sole basis for treatment. Nasal washings and aspirates are unacceptable for Xpert Xpress SARS-CoV-2/FLU/RSV testing.  Fact Sheet for Patients: BloggerCourse.com  Fact Sheet for Healthcare Providers: SeriousBroker.it  This test is not yet approved or cleared by the [place] FDA and has been authorized for detection and/or diagnosis of SARS-CoV-2 by FDA under an Emergency Use Authorization (EUA). This EUA will remain in effect (meaning this test can be used) for the duration of  the COVID-19 declaration under Section 564(b)(1) of the Act, 21 U.S.C. section 360bbb-3(b)(1), unless the authorization is terminated or revoked.     Resp Syncytial Virus by PCR NEGATIVE NEGATIVE Final    Comment: (NOTE) Fact Sheet for Patients: BloggerCourse.com  Fact Sheet for Healthcare Providers: SeriousBroker.it  This test is not yet approved or cleared by the [place] FDA and has been authorized for detection and/or diagnosis of SARS-CoV-2 by FDA under an Emergency Use Authorization (EUA). This EUA will remain in effect (meaning this test can be used) for the duration of the COVID-19 declaration under Section 564(b)(1) of the Act, 21 U.S.C. section 360bbb-3(b)(1), unless the authorization is terminated or revoked.  Performed at [facility], [address], [place], [place] [address]   Respiratory (~20 pathogens) panel by PCR     Status: None   Collection Time: 01/02/23  9:56 PM   Specimen: Nasopharyngeal Swab; Respiratory  Result Value Ref Range Status   Adenovirus NOT DETECTED NOT DETECTED Final   Coronavirus 229E NOT DETECTED NOT DETECTED Final    Comment: (NOTE) The Coronavirus on the Respiratory Panel, DOES NOT test for the novel  Coronavirus (2019 nCoV)    Coronavirus HKU1 NOT DETECTED NOT DETECTED  Final   Coronavirus NL63 NOT DETECTED NOT DETECTED Final   Coronavirus OC43 NOT DETECTED NOT DETECTED Final   Metapneumovirus NOT DETECTED NOT DETECTED Final   Rhinovirus / Enterovirus NOT DETECTED NOT DETECTED Final   Influenza A NOT DETECTED NOT DETECTED Final   Influenza B NOT DETECTED NOT DETECTED Final   Parainfluenza Virus 1 NOT DETECTED NOT DETECTED Final   Parainfluenza Virus 2 NOT DETECTED NOT DETECTED Final   Parainfluenza Virus 3 NOT DETECTED NOT DETECTED Final   Parainfluenza Virus 4 NOT DETECTED NOT DETECTED Final   Respiratory Syncytial Virus NOT DETECTED NOT DETECTED Final   Bordetella pertussis NOT DETECTED NOT DETECTED Final   Bordetella Parapertussis NOT DETECTED NOT DETECTED Final   Chlamydophila pneumoniae NOT DETECTED NOT DETECTED Final   Mycoplasma pneumoniae NOT DETECTED NOT DETECTED Final    Comment: Performed at [facility], [address], [place], [place] [address]  Urine Culture (for pregnant, neutropenic or urologic patients or patients with an indwelling urinary catheter)     Status: Abnormal (Preliminary result)   Collection Time: 01/02/23 11:06 PM   Specimen: Urine, Clean Catch  Result Value Ref Range Status   Specimen Description   Final    URINE, CLEAN CATCH Performed at [facility], [address] [place], [place] [address]    Special Requests   Final    NONE Performed at [facility], [address], [place], [place] [address]    Culture (A)  Final    50,000 COLONIES/mL GRAM NEGATIVE RODS IDENTIFICATION AND SUSCEPTIBILITIES TO FOLLOW Performed at [facility], [address] [place], [place] [address]    Report Status PENDING  Incomplete  MRSA Next Gen by PCR, Nasal     Status: None   Collection Time: 01/03/23 12:43 AM   Specimen: Nasal Mucosa; Nasal Swab  Result Value Ref Range Status   MRSA by PCR Next Gen NOT DETECTED NOT DETECTED Final    Comment: (NOTE) The GeneXpert MRSA Assay (FDA  approved for NASAL specimens only), is one component of a comprehensive MRSA colonization surveillance program. It is not intended to diagnose MRSA infection nor to guide or monitor treatment for MRSA infections. Test performance is not FDA approved  in patients less than 19 years old. Performed at [facility], [address], [place], [place] [address]      Medications:    Chlorhexidine Gluconate Cloth  6 each Topical Daily   guaiFENesin  600 mg Oral BID   ipratropium-albuterol  3 mL Nebulization QID   levothyroxine  125 mcg Oral Q0600   mouth rinse  15 mL Mouth Rinse 4 times per day   potassium chloride  20 mEq Oral Once   predniSONE  10 mg Oral Q breakfast   Continuous Infusions:  sodium chloride 75 mL/hr at 01/03/23 2010   cefTRIAXone (ROCEPHIN)  IV Stopped (01/03/23 2055)      LOS: 2 days   Marinda Elk  [facility]  01/04/2023, 7:01 AM

## 2023-01-05 DIAGNOSIS — J441 Chronic obstructive pulmonary disease with (acute) exacerbation: Secondary | ICD-10-CM | POA: Diagnosis not present

## 2023-01-05 LAB — CULTURE, BLOOD (ROUTINE X 2): Special Requests: ADEQUATE

## 2023-01-05 LAB — BASIC METABOLIC PANEL
Anion gap: 8 (ref 5–15)
BUN: 28 mg/dL — ABNORMAL HIGH (ref 8–23)
CO2: 24 mmol/L (ref 22–32)
Calcium: 8.3 mg/dL — ABNORMAL LOW (ref 8.9–10.3)
Chloride: 104 mmol/L (ref 98–111)
Creatinine, Ser: 1 mg/dL (ref 0.44–1.00)
GFR, Estimated: 52 mL/min — ABNORMAL LOW (ref >60)
Glucose, Bld: 99 mg/dL (ref 70–99)
Potassium: 4.4 mmol/L (ref 3.5–5.1)
Sodium: 136 mmol/L (ref 135–145)

## 2023-01-05 LAB — LEGIONELLA PNEUMOPHILA SEROGP 1 UR AG: L. pneumophila Serogp 1 Ur Ag: NEGATIVE

## 2023-01-05 LAB — GLUCOSE, CAPILLARY
Glucose-Capillary: 106 mg/dL — ABNORMAL HIGH (ref 70–99)
Glucose-Capillary: 123 mg/dL — ABNORMAL HIGH (ref 70–99)

## 2023-01-05 NOTE — TOC Initial Note (Signed)
Transition of Care [facility]) - Initial/Assessment Note   Patient Details  Name: Diamond Holland MRN: [id number] Date of Birth: 02-17-1927  Transition of Care [facility]) CM/SW Contact:    Ewing Schlein, LCSW Phone Number: 01/05/2023, 3:15 PM  Clinical Narrative: PT evaluation recommended HH. Patient is currently on 2L/min oxygen.                Expected Discharge Plan: Home w Home Health Services Barriers to Discharge: Continued Medical Work up  Patient Goals and CMS Choice Patient states their goals for this hospitalization and ongoing recovery are:: Return home  Expected Discharge Plan and Services In-house Referral: Clinical Social Work Post Acute Care Choice: Home Health Living arrangements for the past 2 months: Single Family Home  Prior Living Arrangements/Services Living arrangements for the past 2 months: Single Family Home Patient language and need for interpreter reviewed:: Yes Need for Family Participation in Patient Care: No (Comment) Care giver support system in place?: Yes (comment) Criminal Activity/Legal Involvement Pertinent to Current Situation/Hospitalization: No - Comment as needed  Activities of Daily Living Home Assistive Devices/Equipment: Walker (specify type), Shower chair without back, Grab bars in shower, Eyeglasses, Hearing aid, Scales, Dentures (specify type) ADL Screening (condition at time of admission) Patient's cognitive ability adequate to safely complete daily activities?: Yes Is the patient deaf or have difficulty hearing?: Yes Does the patient have difficulty seeing, even when wearing glasses/contacts?: No Does the patient have difficulty concentrating, remembering, or making decisions?: Yes Patient able to express need for assistance with ADLs?: Yes Does the patient have difficulty dressing or bathing?: No Independently performs ADLs?: Yes (appropriate for developmental age) Does the patient have difficulty walking or climbing stairs?: Yes Weakness  of Legs: None Weakness of Arms/Hands: None  Emotional Assessment Appearance:: Appears stated age Orientation: : Oriented to Self, Oriented to Place, Oriented to  Time, Oriented to Situation Alcohol / Substance Use: Not Applicable Psych Involvement: No (comment)  Admission diagnosis:  Hypoxia [R09.02] SIRS (systemic inflammatory response syndrome) (HCC) [R65.10] AKI (acute kidney injury) (HCC) [N17.9] COPD with acute exacerbation (HCC) [J44.1] Elevated lactic acid level [R79.89] Pneumonia due to infectious organism, unspecified laterality, unspecified part of lung [J18.9] Patient Active Problem List   Diagnosis Date Noted   Hypothyroidism 01/03/2023   Elevated CK 01/03/2023   UTI (urinary tract infection) 01/03/2023   AKI (acute kidney injury) (HCC) 01/02/2023   CAP (community acquired pneumonia) 01/02/2023   Sepsis (HCC) 01/02/2023   COPD mixed type (HCC) 01/02/2023   Acute respiratory failure with hypoxia (HCC) 01/02/2023   COPD with acute exacerbation (HCC) 01/02/2023   PCP:  Ralene Ok, MD Pharmacy:   [facility] - [place], [place] - [address] [place] [address] Phone: [phone] Fax: [phone]  [facility] - [place], [place] - [address] [place] [address] Phone: [phone] Fax: [phone]  Social Determinants of Health (SDOH) Social History: SDOH Screenings   Food Insecurity: No Food Insecurity (01/04/2023)  Housing: Low Risk  (01/04/2023)  Transportation Needs: No Transportation Needs (01/04/2023)  Utilities: Not At Risk (01/04/2023)  Tobacco Use: Medium Risk (01/03/2023)   SDOH Interventions:    Readmission Risk Interventions     No data to display

## 2023-01-05 NOTE — Progress Notes (Signed)
Physical Therapy Treatment Patient Details Name: Diamond Holland MRN: [id number] DOB: 1926-09-05 Today's Date: 01/05/2023   History of Present Illness Pt is a 89 year old female admitted 01/02/23 for acute respiratory failure with hypoxia secondary to community-acquired pneumonia.  PMHx: emphysema, DM, asthma, allergies, COPD    PT Comments    Pt amb 180 ft with RW and supv, good steadiness, step through gait pattern with trunk flexed forward, no overt LOB. Pt on RA with no dyspnea noted, poor waveform throughout ambulation on RA, able to read once sitting EOB after ambulation 92-93%. Notified RN of SpO2 on RA.  Of note, daughter reports pt's baseline is ind with RW in the home and rollator in the community, ind with self care, drives, goes shopping.   Recommendations for follow up therapy are one component of a multi-disciplinary discharge planning process, led by the attending physician.  Recommendations may be updated based on patient status, additional functional criteria and insurance authorization.  Follow Up Recommendations       Assistance Recommended at Discharge Set up Supervision/Assistance  Patient can return home with the following A little help with walking and/or transfers;A little help with bathing/dressing/bathroom;Assistance with cooking/housework;Assist for transportation;Help with stairs or ramp for entrance   Equipment Recommendations  None recommended by PT    Recommendations for Other Services       Precautions / Restrictions Precautions Precautions: Fall Precaution Comments: monitor sats Restrictions Weight Bearing Restrictions: No     Mobility  Bed Mobility               General bed mobility comments: sitting EOB    Transfers Overall transfer level: Needs assistance Equipment used: Rolling walker (2 wheels) Transfers: Sit to/from Stand Sit to Stand: Supervision           General transfer comment: supv to power to stand with transfer  with RW, good safety awareness    Ambulation/Gait Ambulation/Gait assistance: Supervision Gait Distance (Feet): 180 Feet Assistive device: Rolling walker (2 wheels) Gait Pattern/deviations: Step-through pattern, Decreased stride length, Trunk flexed Gait velocity: decreased     General Gait Details: step through gait pattern with RW, trunk flexed forward, no overt LOB, good bil foot clearance, no SOB noted, poor waveform unable to read until back in room and seated EOB reading 92-93% on RA- notified RN   Stairs             Wheelchair Mobility    Modified Rankin (Stroke Patients Only)       Balance Overall balance assessment: No apparent balance deficits (not formally assessed)                                          Cognition Arousal/Alertness: Awake/alert Behavior During Therapy: WFL for tasks assessed/performed Overall Cognitive Status: Within Functional Limits for tasks assessed                                          Exercises      General Comments        Pertinent Vitals/Pain Pain Assessment Pain Assessment: No/denies pain    Home Living Family/patient expects to be discharged to:: Private residence Living Arrangements: Children Available Help at Discharge: Family  Home Equipment: Agricultural consultant (2 wheels);Rollator (4 wheels)      Prior Function            PT Goals (current goals can now be found in the care plan section) Acute Rehab PT Goals PT Goal Formulation: With patient Time For Goal Achievement: 01/18/23 Potential to Achieve Goals: Good Progress towards PT goals: Progressing toward goals    Frequency    Min 1X/week      PT Plan Current plan remains appropriate    Co-evaluation              AM-PAC PT "6 Clicks" Mobility   Outcome Measure  Help needed turning from your back to your side while in a flat bed without using bedrails?: A Little Help needed moving from  lying on your back to sitting on the side of a flat bed without using bedrails?: A Little Help needed moving to and from a bed to a chair (including a wheelchair)?: A Little Help needed standing up from a chair using your arms (e.g., wheelchair or bedside chair)?: A Little Help needed to walk in hospital room?: A Little Help needed climbing 3-5 steps with a railing? : A Little 6 Click Score: 18    End of Session Equipment Utilized During Treatment: Gait belt Activity Tolerance: Patient tolerated treatment well Patient left: in bed;with call bell/phone within reach Nurse Communication: Mobility status PT Visit Diagnosis: Difficulty in walking, not elsewhere classified (R26.2)     Time: 1610-9604 PT Time Calculation (min) (ACUTE ONLY): 19 min  Charges:  $Gait Training: 8-22 mins                     Tori Henrry Feil PT, DPT 01/05/23, 1:00 PM

## 2023-01-05 NOTE — Care Management Important Message (Signed)
Important Message  Patient Details IM Letter given. Name: Diamond Holland MRN: [id number] Date of Birth: [birth year]   Medicare Important Message Given:  Yes     Caren Macadam 01/05/2023, 9:40 AM

## 2023-01-05 NOTE — Progress Notes (Signed)
[facility] PROGRESS NOTE    Progress Note  Diamond Holland  UJW:[id number] DOB: 15-Mar-1927 DOA: 01/02/2023 PCP: Ralene Ok, MD     Brief Narrative:   Diamond Holland is an 89 y.o. female past medical history significant for essential hypertension COPD comes in with confusion and fever as per family members as the patient cannot provide history generalized fatigue confusion that started 2 days prior to arrival started on BiPAP upon arrival to the ED  Assessment/Plan:   Acute respiratory failure with hypoxia secondary to E. coli bacteremia: Has been in to room air. Continue IV empiric Rocephin and azithromycin. Blood cultures grew E. coli sensitivities are pending. and urine culture grew E. coli less than 50,000 colonies pansensitive Discontinue azithromycin. PT evaluated the patient, will need home health.  Severe sepsis likely due to E. coli UTI: With acute kidney injury and confusion. Continue IV Rocephin blood cultures and urine cultures were positive for E. coli.  Acute kidney injury: Unknown baseline. Admission to 2.3, started on IV fluids creatinine this morning is 1.0. ACE inhibitor has been held. Strict I's and O's.    Hypokalemia: Replete orally recheck in the morning.  COPD with acute exacerbation (HCC) Probably contributing continue current home meds inhalers. Continue albuterol scheduled. Currently on BiPAP. Continue inhalers antibiotics and steroids. She is currently not wheezing, will taper off antibiotic course quickly.  Hypothyroidism: Continue Synthroid.  Nontraumatic rhabdomyolysis: Improving with IV fluids.  Hypovolemic hyponatremia: Continue IV fluids.   DVT prophylaxis: lovenox Family Communication:family Status is: Inpatient Remains inpatient appropriate because: Acute respiratory failure with hypoxia    Code Status:     Code Status Orders  (From admission, onward)           Start     Ordered   01/02/23 2234  Do not  attempt resuscitation (DNR)  Continuous       Question Answer Comment  If patient has no pulse and is not breathing Do Not Attempt Resuscitation   If patient has a pulse and/or is breathing: Medical Treatment Goals LIMITED ADDITIONAL INTERVENTIONS: Use medication/IV fluids and cardiac monitoring as indicated; Do not use intubation or mechanical ventilation (DNI), also provide comfort medications.  Transfer to Progressive/Stepdown as indicated, avoid Intensive Care.   Consent: Discussion documented in EHR or advanced directives reviewed      01/02/23 2235           Code Status History     This patient has a current code status but no historical code status.         IV Access:   Peripheral IV   Procedures and diagnostic studies:   ECHOCARDIOGRAM COMPLETE  Result Date: 01/03/2023    ECHOCARDIOGRAM REPORT   Patient Name:   Diamond Holland Date of Exam: 01/03/2023 Medical Rec #:  [id number]       Height:       66.0 in Accession #:    [id number]      Weight:       120.8 lb Date of Birth:  10-Feb-1927       BSA:          1.614 m Patient Age:    89 years        BP:           139/64 mmHg Patient Gender: F               HR:           95 bpm. Exam Location:  Inpatient Procedure: 2D Echo, Cardiac Doppler and Color Doppler Indications:    Dyspnea R06.00  History:        Patient has no prior history of Echocardiogram examinations.                 COPD; Risk Factors:Diabetes.  Sonographer:    Lucendia Herrlich Referring Phys: [id number] ANASTASSIA DOUTOVA IMPRESSIONS  1. Left ventricular ejection fraction, by estimation, is 55 to 60%. The left ventricle has normal function. The left ventricle has no regional wall motion abnormalities. There is mild concentric left ventricular hypertrophy. Left ventricular diastolic parameters are consistent with Grade II diastolic dysfunction (pseudonormalization).  2. Right ventricular systolic function is normal. The right ventricular size is moderately enlarged.  3. Left  atrial size was moderately dilated.  4. Prominent Crista terminalis. Right atrial size was moderately dilated.  5. The mitral valve is normal in structure. Trivial mitral valve regurgitation. No evidence of mitral stenosis.  6. Tricuspid valve regurgitation is moderate.  7. The aortic valve is abnormal. Aortic valve regurgitation is not visualized. Aortic valve sclerosis/calcification is present, without any evidence of aortic stenosis.  8. The inferior vena cava is normal in size with greater than 50% respiratory variability, suggesting right atrial pressure of 3 mmHg. Comparison(s): No prior Echocardiogram. FINDINGS  Left Ventricle: Left ventricular ejection fraction, by estimation, is 55 to 60%. The left ventricle has normal function. The left ventricle has no regional wall motion abnormalities. The left ventricular internal cavity size was small. There is mild concentric left ventricular hypertrophy. Left ventricular diastolic parameters are consistent with Grade II diastolic dysfunction (pseudonormalization). Right Ventricle: The right ventricular size is moderately enlarged. No increase in right ventricular wall thickness. Right ventricular systolic function is normal. Left Atrium: Left atrial size was moderately dilated. Right Atrium: Prominent Crista terminalis. Right atrial size was moderately dilated. Prominent Crista terminalis. Pericardium: There is no evidence of pericardial effusion. Mitral Valve: The mitral valve is normal in structure. Trivial mitral valve regurgitation. No evidence of mitral valve stenosis. Tricuspid Valve: The tricuspid valve is normal in structure. Tricuspid valve regurgitation is moderate . No evidence of tricuspid stenosis. Aortic Valve: Functional Biscuspid with mildly reduced LV stroke volume index but with no significant LVOT gradient. The aortic valve is abnormal. Aortic valve regurgitation is not visualized. Aortic valve sclerosis/calcification is present, without any  evidence of aortic stenosis. Aortic valve peak gradient measures 17.6 mmHg. Pulmonic Valve: The pulmonic valve was normal in structure. Pulmonic valve regurgitation is not visualized. No evidence of pulmonic stenosis. Aorta: The aortic root and ascending aorta are structurally normal, with no evidence of dilitation. Venous: The inferior vena cava is normal in size with greater than 50% respiratory variability, suggesting right atrial pressure of 3 mmHg. IAS/Shunts: The interatrial septum appears to be lipomatous. No atrial level shunt detected by color flow Doppler.  LEFT VENTRICLE PLAX 2D LVIDd:         3.30 cm   Diastology LVIDs:         2.30 cm   LV e' medial:    3.81 cm/s LV PW:         1.20 cm   LV E/e' medial:  24.1 LV IVS:        1.10 cm   LV e' lateral:   6.96 cm/s LVOT diam:     2.00 cm   LV E/e' lateral: 13.2 LV SV:         55 LV SV Index:   34 LVOT  Area:     3.14 cm  RIGHT VENTRICLE             IVC RV S prime:     12.40 cm/s  IVC diam: 1.70 cm TAPSE (M-mode): 2.0 cm LEFT ATRIUM             Index        RIGHT ATRIUM           Index LA diam:        3.50 cm 2.17 cm/m   RA Area:     18.35 cm LA Vol (A2C):   27.6 ml 17.10 ml/m  RA Volume:   43.70 ml  27.07 ml/m LA Vol (A4C):   31.0 ml 19.21 ml/m LA Biplane Vol: 31.6 ml 19.58 ml/m  AORTIC VALVE AV Area (Vmax): 1.42 cm AV Vmax:        210.00 cm/s AV Peak Grad:   17.6 mmHg LVOT Vmax:      95.00 cm/s LVOT Vmean:     63.500 cm/s LVOT VTI:       0.176 m  AORTA Ao Root diam: 2.90 cm Ao Asc diam:  2.90 cm MITRAL VALVE                TRICUSPID VALVE MV Area (PHT): 3.65 cm     TR Peak grad:   34.3 mmHg MV Decel Time: 208 msec     TR Vmax:        293.00 cm/s MR Peak grad: 68.6 mmHg MR Vmax:      414.00 cm/s   SHUNTS MV E velocity: 91.80 cm/s   Systemic VTI:  0.18 m MV A velocity: 122.00 cm/s  Systemic Diam: 2.00 cm MV E/A ratio:  0.75 Riley Lam MD Electronically signed by Riley Lam MD Signature Date/Time: 01/03/2023/10:57:42 AM    Final       Medical Consultants:   None.   Subjective:    [name] r tolerating her diet feels better this morning  Objective:    Vitals:   01/04/23 1215 01/04/23 1955 01/04/23 2015 01/05/23 0545  BP:  135/71  137/89  Pulse: 83 92  90  Resp: 19 (!) 22  16  Temp:  97.8 F (36.6 C)  97.8 F (36.6 C)  TempSrc:    Oral  SpO2: 96% 98% 98% 96%  Weight:      Height:       SpO2: 96 % O2 Flow Rate (L/min): 2 L/min FiO2 (%): 45 % (spo2 97% on 50% fio2, weaned down to 45%, rn aware)   Intake/Output Summary (Last 24 hours) at 01/05/2023 0745 Last data filed at 01/04/2023 2047 Gross per 24 hour  Intake 352.82 ml  Output --  Net 352.82 ml    Filed Weights   01/03/23 0011  Weight: 54.8 kg    Exam: General exam: In no acute distress. Respiratory system: Good air movement and clear to auscultation. Cardiovascular system: S1 & S2 heard, RRR. No JVD. Gastrointestinal system: Abdomen is nondistended, soft and nontender.  Extremities: No pedal edema. Skin: No rashes, lesions or ulcers Psychiatry: Judgement and insight appear normal. Mood & affect appropriate. Data Reviewed:    Labs: Basic Metabolic Panel: Recent Labs  Lab 01/02/23 1949 01/02/23 2215 01/03/23 0217 01/04/23 0304 01/05/23 0349  NA 131*  --  130* 135 136  K 3.6  --  3.4* 2.9* 4.4  CL 92*  --  94* 100 104  CO2 24  --  23 24  24  GLUCOSE 139*  --  151* 120* 99  BUN 41*  --  42* 37* 28*  CREATININE 2.32*  --  2.03* 1.28* 1.00  CALCIUM 9.3  --  8.5* 8.3* 8.3*  MG  --  1.5* 2.5*  --   --   PHOS  --  4.2 4.1  --   --     GFR Estimated Creatinine Clearance: 29.8 mL/min (by C-G formula based on SCr of 1 mg/dL). Liver Function Tests: Recent Labs  Lab 01/02/23 1949 01/03/23 0217  AST 51* 50*  ALT 26 26  ALKPHOS 51 49  BILITOT 0.5 0.7  PROT 7.0 5.4*  ALBUMIN 3.3* 2.6*    No results for input(s): "LIPASE", "AMYLASE" in the last 168 hours. Recent Labs  Lab 01/02/23 2214-02-10  AMMONIA 19     Coagulation profile Recent Labs  Lab 01/02/23 1949  INR 1.3*    COVID-19 Labs  No results for input(s): "DDIMER", "FERRITIN", "LDH", "CRP" in the last 72 hours.  Lab Results  Component Value Date   SARSCOV2NAA NEGATIVE 01/02/2023    CBC: Recent Labs  Lab 01/02/23 1949 01/03/23 0217  WBC 17.5* 12.9*  NEUTROABS 15.6*  --   HGB 13.3 11.1*  HCT 38.7 32.1*  MCV 91.9 92.0  PLT 172 144*    Cardiac Enzymes: Recent Labs  Lab 01/02/23 Feb 10, 2214 01/03/23 0217 01/04/23 0304  CKTOTAL 870* 805* 370*    BNP (last 3 results) No results for input(s): "PROBNP" in the last 8760 hours. CBG: Recent Labs  Lab 01/04/23 0108 01/04/23 0454 01/04/23 0840 01/04/23 1122 01/04/23 1657  GLUCAP 108* 110* 106* 123* 134*    D-Dimer: No results for input(s): "DDIMER" in the last 72 hours. Hgb A1c: No results for input(s): "HGBA1C" in the last 72 hours. Lipid Profile: No results for input(s): "CHOL", "HDL", "LDLCALC", "TRIG", "CHOLHDL", "LDLDIRECT" in the last 72 hours. Thyroid function studies: Recent Labs    01/02/23 Feb 10, 2214  TSH 18.142*    Anemia work up: No results for input(s): "VITAMINB12", "FOLATE", "FERRITIN", "TIBC", "IRON", "RETICCTPCT" in the last 72 hours. Sepsis Labs: Recent Labs  Lab 01/02/23 1949 01/02/23 2148 01/02/23 02-10-2214 01/03/23 0217  PROCALCITON  --   --  21.33  --   WBC 17.5*  --   --  12.9*  LATICACIDVEN 2.9* 3.9*  --  3.1*    Microbiology Recent Results (from the past 240 hour(s))  Culture, blood (Routine x 2)     Status: Abnormal (Preliminary result)   Collection Time: 01/02/23  7:49 PM   Specimen: BLOOD  Result Value Ref Range Status   Specimen Description   Final    BLOOD BLOOD LEFT FOREARM Performed at [facility], [address] [place], [place] [address]    Special Requests   Final    BOTTLES DRAWN AEROBIC AND ANAEROBIC Blood Culture adequate volume Performed at [facility], [address] [place], [place] [address]    Culture  Setup Time   Final    GRAM NEGATIVE RODS IN BOTH AEROBIC AND ANAEROBIC BOTTLES CRITICAL RESULT CALLED TO, READ BACK BY AND VERIFIED WITH: PHARMD JUSTIN L 1302 [id number] FCP    Culture (A)  Final    ESCHERICHIA COLI SUSCEPTIBILITIES TO FOLLOW Performed at [facility], [address] [place], [place] [address]    Report Status PENDING  Incomplete  Blood Culture ID Panel (Reflexed)     Status: Abnormal   Collection Time: 01/02/23  7:49  PM  Result Value Ref Range Status   Enterococcus faecalis NOT DETECTED NOT DETECTED Final   Enterococcus Faecium NOT DETECTED NOT DETECTED Final   Listeria monocytogenes NOT DETECTED NOT DETECTED Final   Staphylococcus species NOT DETECTED NOT DETECTED Final   Staphylococcus aureus (BCID) NOT DETECTED NOT DETECTED Final   Staphylococcus epidermidis NOT DETECTED NOT DETECTED Final   Staphylococcus lugdunensis NOT DETECTED NOT DETECTED Final   Streptococcus species NOT DETECTED NOT DETECTED Final   Streptococcus agalactiae NOT DETECTED NOT DETECTED Final   Streptococcus pneumoniae NOT DETECTED NOT DETECTED Final   Streptococcus pyogenes NOT DETECTED NOT DETECTED Final   A.calcoaceticus-baumannii NOT DETECTED NOT DETECTED Final   Bacteroides fragilis NOT DETECTED NOT DETECTED Final   Enterobacterales DETECTED (A) NOT DETECTED Final    Comment: Enterobacterales represent a large order of gram negative bacteria, not a single organism. CRITICAL RESULT CALLED TO, READ BACK BY AND VERIFIED WITH: PHARMD JUSTIN L 1302 [id number] FCP    Enterobacter cloacae complex NOT DETECTED NOT DETECTED Final   Escherichia coli DETECTED (A) NOT DETECTED Final    Comment: CRITICAL RESULT CALLED TO, READ BACK BY AND VERIFIED WITH: PHARMD JUSTIN L 1302 [id number] FCP    Klebsiella aerogenes NOT DETECTED NOT DETECTED Final   Klebsiella oxytoca NOT DETECTED NOT DETECTED Final   Klebsiella pneumoniae NOT DETECTED NOT DETECTED Final    Proteus species NOT DETECTED NOT DETECTED Final   Salmonella species NOT DETECTED NOT DETECTED Final   Serratia marcescens NOT DETECTED NOT DETECTED Final   Haemophilus influenzae NOT DETECTED NOT DETECTED Final   Neisseria meningitidis NOT DETECTED NOT DETECTED Final   Pseudomonas aeruginosa NOT DETECTED NOT DETECTED Final   Stenotrophomonas maltophilia NOT DETECTED NOT DETECTED Final   Candida albicans NOT DETECTED NOT DETECTED Final   Candida auris NOT DETECTED NOT DETECTED Final   Candida glabrata NOT DETECTED NOT DETECTED Final   Candida krusei NOT DETECTED NOT DETECTED Final   Candida parapsilosis NOT DETECTED NOT DETECTED Final   Candida tropicalis NOT DETECTED NOT DETECTED Final   Cryptococcus neoformans/gattii NOT DETECTED NOT DETECTED Final   CTX-M ESBL NOT DETECTED NOT DETECTED Final   Carbapenem resistance IMP NOT DETECTED NOT DETECTED Final   Carbapenem resistance KPC NOT DETECTED NOT DETECTED Final   Carbapenem resistance NDM NOT DETECTED NOT DETECTED Final   Carbapenem resist OXA 48 LIKE NOT DETECTED NOT DETECTED Final   Carbapenem resistance VIM NOT DETECTED NOT DETECTED Final    Comment: Performed at [facility], [address] [place], [place] [address]  Resp panel by RT-PCR (RSV, Flu A&B, Covid) Anterior Nasal Swab     Status: None   Collection Time: 01/02/23  9:56 PM   Specimen: Anterior Nasal Swab  Result Value Ref Range Status   SARS Coronavirus 2 by RT PCR NEGATIVE NEGATIVE Final    Comment: (NOTE) SARS-CoV-2 target nucleic acids are NOT DETECTED.  The SARS-CoV-2 RNA is generally detectable in upper respiratory specimens during the acute phase of infection. The lowest concentration of SARS-CoV-2 viral copies this assay can detect is 138 copies/mL. A negative result does not preclude SARS-Cov-2 infection and should not be used as the sole basis for treatment or other patient management decisions. A negative result may occur with  improper specimen  collection/handling, submission of specimen other than nasopharyngeal swab, presence of viral mutation(s) within the areas targeted by this assay, and inadequate number of viral copies(<138 copies/mL). A negative result must be combined with clinical observations, patient  history, and epidemiological information. The expected result is Negative.  Fact Sheet for Patients:  BloggerCourse.com  Fact Sheet for Healthcare Providers:  SeriousBroker.it  This test is no t yet approved or cleared by the [place] FDA and  has been authorized for detection and/or diagnosis of SARS-CoV-2 by FDA under an Emergency Use Authorization (EUA). This EUA will remain  in effect (meaning this test can be used) for the duration of the COVID-19 declaration under Section 564(b)(1) of the Act, 21 U.S.C.section 360bbb-3(b)(1), unless the authorization is terminated  or revoked sooner.       Influenza A by PCR NEGATIVE NEGATIVE Final   Influenza B by PCR NEGATIVE NEGATIVE Final    Comment: (NOTE) The Xpert Xpress SARS-CoV-2/FLU/RSV plus assay is intended as an aid in the diagnosis of influenza from Nasopharyngeal swab specimens and should not be used as a sole basis for treatment. Nasal washings and aspirates are unacceptable for Xpert Xpress SARS-CoV-2/FLU/RSV testing.  Fact Sheet for Patients: BloggerCourse.com  Fact Sheet for Healthcare Providers: SeriousBroker.it  This test is not yet approved or cleared by the [place] FDA and has been authorized for detection and/or diagnosis of SARS-CoV-2 by FDA under an Emergency Use Authorization (EUA). This EUA will remain in effect (meaning this test can be used) for the duration of the COVID-19 declaration under Section 564(b)(1) of the Act, 21 U.S.C. section 360bbb-3(b)(1), unless the authorization is terminated or revoked.     Resp Syncytial  Virus by PCR NEGATIVE NEGATIVE Final    Comment: (NOTE) Fact Sheet for Patients: BloggerCourse.com  Fact Sheet for Healthcare Providers: SeriousBroker.it  This test is not yet approved or cleared by the [place] FDA and has been authorized for detection and/or diagnosis of SARS-CoV-2 by FDA under an Emergency Use Authorization (EUA). This EUA will remain in effect (meaning this test can be used) for the duration of the COVID-19 declaration under Section 564(b)(1) of the Act, 21 U.S.C. section 360bbb-3(b)(1), unless the authorization is terminated or revoked.  Performed at [facility], [address], [place], [place] [address]   Respiratory (~20 pathogens) panel by PCR     Status: None   Collection Time: 01/02/23  9:56 PM   Specimen: Nasopharyngeal Swab; Respiratory  Result Value Ref Range Status   Adenovirus NOT DETECTED NOT DETECTED Final   Coronavirus 229E NOT DETECTED NOT DETECTED Final    Comment: (NOTE) The Coronavirus on the Respiratory Panel, DOES NOT test for the novel  Coronavirus (2019 nCoV)    Coronavirus HKU1 NOT DETECTED NOT DETECTED Final   Coronavirus NL63 NOT DETECTED NOT DETECTED Final   Coronavirus OC43 NOT DETECTED NOT DETECTED Final   Metapneumovirus NOT DETECTED NOT DETECTED Final   Rhinovirus / Enterovirus NOT DETECTED NOT DETECTED Final   Influenza A NOT DETECTED NOT DETECTED Final   Influenza B NOT DETECTED NOT DETECTED Final   Parainfluenza Virus 1 NOT DETECTED NOT DETECTED Final   Parainfluenza Virus 2 NOT DETECTED NOT DETECTED Final   Parainfluenza Virus 3 NOT DETECTED NOT DETECTED Final   Parainfluenza Virus 4 NOT DETECTED NOT DETECTED Final   Respiratory Syncytial Virus NOT DETECTED NOT DETECTED Final   Bordetella pertussis NOT DETECTED NOT DETECTED Final   Bordetella Parapertussis NOT DETECTED NOT DETECTED Final   Chlamydophila pneumoniae NOT DETECTED NOT DETECTED Final    Mycoplasma pneumoniae NOT DETECTED NOT DETECTED Final    Comment: Performed at [facility], [address] [place], [place] [address]  Urine  Culture (for pregnant, neutropenic or urologic patients or patients with an indwelling urinary catheter)     Status: Abnormal   Collection Time: 01/02/23 11:06 PM   Specimen: Urine, Clean Catch  Result Value Ref Range Status   Specimen Description   Final    URINE, CLEAN CATCH Performed at [facility], [address] [place], [place] [address]    Special Requests   Final    NONE Performed at [facility], [address] [place], [place] [address]    Culture 50,000 COLONIES/mL ESCHERICHIA COLI (A)  Final   Report Status 01/04/2023 FINAL  Final   Organism ID, Bacteria ESCHERICHIA COLI (A)  Final      Susceptibility   Escherichia coli - MIC*    AMPICILLIN <=2 SENSITIVE Sensitive     CEFAZOLIN <=4 SENSITIVE Sensitive     CEFEPIME <=0.12 SENSITIVE Sensitive     CEFTRIAXONE <=0.25 SENSITIVE Sensitive     CIPROFLOXACIN <=0.25 SENSITIVE Sensitive     GENTAMICIN <=1 SENSITIVE Sensitive     IMIPENEM <=0.25 SENSITIVE Sensitive     NITROFURANTOIN <=16 SENSITIVE Sensitive     TRIMETH/SULFA <=20 SENSITIVE Sensitive     AMPICILLIN/SULBACTAM <=2 SENSITIVE Sensitive     PIP/TAZO <=4 SENSITIVE Sensitive     * 50,000 COLONIES/mL ESCHERICHIA COLI  MRSA Next Gen by PCR, Nasal     Status: None   Collection Time: 01/03/23 12:43 AM   Specimen: Nasal Mucosa; Nasal Swab  Result Value Ref Range Status   MRSA by PCR Next Gen NOT DETECTED NOT DETECTED Final    Comment: (NOTE) The GeneXpert MRSA Assay (FDA approved for NASAL specimens only), is one component of a comprehensive MRSA colonization surveillance program. It is not intended to diagnose MRSA infection nor to guide or monitor treatment for MRSA infections. Test performance is not FDA approved in patients less than 30 years old. Performed at [facility], [address] [place], [place] [address]   Culture, blood (Routine x 2)     Status: None (Preliminary result)   Collection Time: 01/03/23  2:17 AM   Specimen: BLOOD RIGHT ARM  Result Value Ref Range Status   Specimen Description   Final    BLOOD RIGHT ARM Performed at [facility], [address] [place], [place] [address]    Special Requests   Final    BOTTLES DRAWN AEROBIC AND ANAEROBIC Blood Culture adequate volume Performed at [facility], [address], [place], [place] [address]    Culture   Final    NO GROWTH 1 DAY Performed at [facility], [address], [place], [place] [address]    Report Status PENDING  Incomplete     Medications:    Chlorhexidine Gluconate Cloth  6 each Topical Daily   guaiFENesin  600 mg Oral BID   ipratropium-albuterol  3 mL Nebulization TID   levothyroxine  125 mcg Oral Q0600   mouth rinse  15 mL Mouth Rinse 4 times per day   Continuous Infusions:  cefTRIAXone (ROCEPHIN)  IV 2 g (01/04/23 2047)      LOS: 3 days   Marinda Elk  [facility]  01/05/2023, 7:45 AM

## 2023-01-05 NOTE — Progress Notes (Signed)
PT Cancellation Note  Patient Details Name: Diamond Holland MRN: [id number] DOB: 1926-11-16   Cancelled Treatment:    Reason Eval/Treat Not Completed: Other (comment). Pt eating breakfast with daughter, reports nausea this morning and requests therapy check back later. Will continue to follow.    [name] PT, DPT 01/05/23, 11:10 AM

## 2023-01-06 DIAGNOSIS — J441 Chronic obstructive pulmonary disease with (acute) exacerbation: Secondary | ICD-10-CM | POA: Diagnosis not present

## 2023-01-06 DIAGNOSIS — R0902 Hypoxemia: Secondary | ICD-10-CM | POA: Diagnosis not present

## 2023-01-06 DIAGNOSIS — N179 Acute kidney failure, unspecified: Secondary | ICD-10-CM | POA: Diagnosis not present

## 2023-01-06 DIAGNOSIS — R7881 Bacteremia: Secondary | ICD-10-CM

## 2023-01-06 DIAGNOSIS — R7989 Other specified abnormal findings of blood chemistry: Secondary | ICD-10-CM | POA: Diagnosis not present

## 2023-01-06 DIAGNOSIS — B9620 Unspecified Escherichia coli [E. coli] as the cause of diseases classified elsewhere: Secondary | ICD-10-CM

## 2023-01-06 LAB — BASIC METABOLIC PANEL
Anion gap: 8 (ref 5–15)
BUN: 22 mg/dL (ref 8–23)
CO2: 27 mmol/L (ref 22–32)
Calcium: 8.3 mg/dL — ABNORMAL LOW (ref 8.9–10.3)
Chloride: 100 mmol/L (ref 98–111)
Creatinine, Ser: 0.91 mg/dL (ref 0.44–1.00)
GFR, Estimated: 58 mL/min — ABNORMAL LOW (ref >60)
Glucose, Bld: 113 mg/dL — ABNORMAL HIGH (ref 70–99)
Potassium: 3.5 mmol/L (ref 3.5–5.1)
Sodium: 135 mmol/L (ref 135–145)

## 2023-01-06 MED ORDER — POTASSIUM CHLORIDE CRYS ER 20 MEQ PO TBCR
40.0000 meq | EXTENDED_RELEASE_TABLET | Freq: Two times a day (BID) | ORAL | Status: DC
  Administered 2023-01-06: 40 meq via ORAL
  Filled 2023-01-06: qty 2

## 2023-01-06 MED ORDER — AMOXICILLIN 500 MG PO CAPS: 500.0000 mg | ORAL_CAPSULE | Freq: Three times a day (TID) | ORAL | 0 refills | Status: DC

## 2023-01-06 MED ORDER — CEFDINIR 300 MG PO CAPS: 300.0000 mg | ORAL_CAPSULE | Freq: Two times a day (BID) | ORAL | Status: DC

## 2023-01-06 MED ORDER — CEFDINIR 300 MG PO CAPS: 300.0000 mg | ORAL_CAPSULE | Freq: Two times a day (BID) | ORAL | 0 refills | Status: DC

## 2023-01-06 MED ORDER — AMOXICILLIN 500 MG PO CAPS
500.0000 mg | ORAL_CAPSULE | Freq: Three times a day (TID) | ORAL | Status: DC
  Administered 2023-01-06: 500 mg via ORAL
  Filled 2023-01-06 (×2): qty 1

## 2023-01-06 NOTE — Progress Notes (Signed)
   01/05/23 2014  BiPAP/CPAP/SIPAP  Reason BIPAP/CPAP not in use Other(comment) (PRN-Not in Room)  FiO2 (%) 94 %  BiPAP/CPAP /SiPAP Vitals  Bilateral Breath Sounds Clear;Diminished

## 2023-01-06 NOTE — Discharge Summary (Addendum)
Physician Discharge Summary  MELANI BRISBANE WGN:[id number] DOB: [birth year] DOA: 01/02/2023  PCP: Ralene Ok, MD  Admit date: 01/02/2023 Discharge date: 01/06/2023  Admitted From: Home Disposition:  home  Recommendations for Outpatient Follow-up:  Follow up with PCP in 1-2 weeks   Home Health:Yes Equipment/Devices:None  Discharge Condition:Stable CODE STATUS:Full Diet recommendation: Heart Healthy   Brief/Interim Summary: 89 y.o. female past medical history significant for essential hypertension COPD comes in with confusion and fever as per family members as the patient cannot provide history generalized fatigue confusion that started 2 days prior to arrival started on BiPAP upon arrival to the ED   Discharge Diagnoses:  Principal Problem:   COPD with acute exacerbation (HCC) Active Problems:   AKI (acute kidney injury) (HCC)   CAP (community acquired pneumonia)   Sepsis (HCC)   COPD mixed type (HCC)   Acute respiratory failure with hypoxia (HCC)   Hypothyroidism   Elevated CK   UTI (urinary tract infection)  Acute respiratory failure with hypoxia probably secondary to E. coli bacteremia: She will start empirically on IV Rocephin and azithromycin. Blood cultures grew E. coli as well as urine cultures pansensitive. Azithromycin was discontinued. She was transition to oral amoxicillin which she will continue for total of 14 days. PT evaluated the patient she will go home with home health PT. Pneumonia has been ruled out.  Severe sepsis likely due to E. coli UTI: With acute kidney injury and confusion this resolved with empiric antibiotics.  Acute kidney injury: With a baseline creatinine of less than 1 in the setting of ACE inhibitor use and sepsis that resolved with fluid resuscitation.  Hypokalemia: Replete orally now resolved.  COPD: She was continued home inhalers she was not on a COPD exacerbation.  Hypothyroidism: Continue Synthroid.  Nontraumatic  rhabdomyolysis: Improved with IV fluids.  Hypovolemic hyponatremia:/Resolved.  Discharge Instructions  Discharge Instructions     Diet - low sodium heart healthy   Complete by: As directed    Increase activity slowly   Complete by: As directed       Allergies as of 01/06/2023       Reactions   Tape Other (See Comments)   SKIN IS VERY DELICATE AND WILL TEAR AND BRUISE EASILY!!!!   Codeine Other (See Comments)   Stomach pain        Medication List     TAKE these medications    albuterol 108 (90 Base) MCG/ACT inhaler Commonly known as: VENTOLIN HFA Inhale 2 puffs into the lungs every 4 (four) hours as needed for wheezing or shortness of breath (cough, shortness of breath or wheezing.).   amoxicillin 875 MG tablet Commonly known as: AMOXIL Take 1 tablet (875 mg total) by mouth 2 (two) times daily.   aspirin EC 81 MG tablet Take 81 mg by mouth daily. Swallow whole.   CALCIUM CITRATE + D PO Take 1 tablet by mouth daily.   cefdinir 300 MG capsule Commonly known as: OMNICEF Take 1 capsule (300 mg total) by mouth every 12 (twelve) hours.   diphenhydrAMINE 25 mg capsule Commonly known as: BENADRYL Take 25 mg by mouth every 6 (six) hours as needed for itching, allergies or sleep.   Glucosamine-Chondroitin-MSM 375-300-237.5 MG Caps Take 1 capsule by mouth daily.   Livalo 2 MG Tabs Generic drug: Pitavastatin Calcium Take 2 mg by mouth in the morning.   losartan 50 MG tablet Commonly known as: COZAAR Take 50 mg by mouth at bedtime.   Multi For Her 50+ Tabs  Take 1 tablet by mouth daily with breakfast.   OMEGA 3 500 PO Take 500 mg by mouth daily.   pantoprazole 20 MG tablet Commonly known as: PROTONIX Take 20 mg by mouth daily before breakfast.   predniSONE 20 MG tablet Commonly known as: DELTASONE Take 2 pills daily for 3 days, then 1 daily for 3 day, then 1/2 daily for 4 days   SEVERE COLD & FLU PO Take 1 capsule by mouth every 4 (four) hours as needed  (for COPD symptoms).   Synthroid 100 MCG tablet Generic drug: levothyroxine Take 100 mcg by mouth daily before breakfast.   triamterene-hydrochlorothiazide 37.5-25 MG capsule Commonly known as: DYAZIDE Take 1 capsule by mouth in the morning.        Allergies  Allergen Reactions   Tape Other (See Comments)    SKIN IS VERY DELICATE AND WILL TEAR AND BRUISE EASILY!!!!   Codeine Other (See Comments)    Stomach pain    Consultations: None   Procedures/Studies: ECHOCARDIOGRAM COMPLETE  Result Date: 01/03/2023    ECHOCARDIOGRAM REPORT   Patient Name:   ESHA FINCHER Date of Exam: 01/03/2023 Medical Rec #:  [id number]       Height:       66.0 in Accession #:    [id number]      Weight:       120.8 lb Date of Birth:  July 05, 1927       BSA:          1.614 m Patient Age:    89 years        BP:           139/64 mmHg Patient Gender: F               HR:           95 bpm. Exam Location:  Inpatient Procedure: 2D Echo, Cardiac Doppler and Color Doppler Indications:    Dyspnea R06.00  History:        Patient has no prior history of Echocardiogram examinations.                 COPD; Risk Factors:Diabetes.  Sonographer:    Lucendia Herrlich Referring Phys: [id number] ANASTASSIA DOUTOVA IMPRESSIONS  1. Left ventricular ejection fraction, by estimation, is 55 to 60%. The left ventricle has normal function. The left ventricle has no regional wall motion abnormalities. There is mild concentric left ventricular hypertrophy. Left ventricular diastolic parameters are consistent with Grade II diastolic dysfunction (pseudonormalization).  2. Right ventricular systolic function is normal. The right ventricular size is moderately enlarged.  3. Left atrial size was moderately dilated.  4. Prominent Crista terminalis. Right atrial size was moderately dilated.  5. The mitral valve is normal in structure. Trivial mitral valve regurgitation. No evidence of mitral stenosis.  6. Tricuspid valve regurgitation is moderate.  7. The  aortic valve is abnormal. Aortic valve regurgitation is not visualized. Aortic valve sclerosis/calcification is present, without any evidence of aortic stenosis.  8. The inferior vena cava is normal in size with greater than 50% respiratory variability, suggesting right atrial pressure of 3 mmHg. Comparison(s): No prior Echocardiogram. FINDINGS  Left Ventricle: Left ventricular ejection fraction, by estimation, is 55 to 60%. The left ventricle has normal function. The left ventricle has no regional wall motion abnormalities. The left ventricular internal cavity size was small. There is mild concentric left ventricular hypertrophy. Left ventricular diastolic parameters are consistent with Grade II diastolic dysfunction (pseudonormalization). Right Ventricle: The  right ventricular size is moderately enlarged. No increase in right ventricular wall thickness. Right ventricular systolic function is normal. Left Atrium: Left atrial size was moderately dilated. Right Atrium: Prominent Crista terminalis. Right atrial size was moderately dilated. Prominent Crista terminalis. Pericardium: There is no evidence of pericardial effusion. Mitral Valve: The mitral valve is normal in structure. Trivial mitral valve regurgitation. No evidence of mitral valve stenosis. Tricuspid Valve: The tricuspid valve is normal in structure. Tricuspid valve regurgitation is moderate . No evidence of tricuspid stenosis. Aortic Valve: Functional Biscuspid with mildly reduced LV stroke volume index but with no significant LVOT gradient. The aortic valve is abnormal. Aortic valve regurgitation is not visualized. Aortic valve sclerosis/calcification is present, without any evidence of aortic stenosis. Aortic valve peak gradient measures 17.6 mmHg. Pulmonic Valve: The pulmonic valve was normal in structure. Pulmonic valve regurgitation is not visualized. No evidence of pulmonic stenosis. Aorta: The aortic root and ascending aorta are structurally  normal, with no evidence of dilitation. Venous: The inferior vena cava is normal in size with greater than 50% respiratory variability, suggesting right atrial pressure of 3 mmHg. IAS/Shunts: The interatrial septum appears to be lipomatous. No atrial level shunt detected by color flow Doppler.  LEFT VENTRICLE PLAX 2D LVIDd:         3.30 cm   Diastology LVIDs:         2.30 cm   LV e' medial:    3.81 cm/s LV PW:         1.20 cm   LV E/e' medial:  24.1 LV IVS:        1.10 cm   LV e' lateral:   6.96 cm/s LVOT diam:     2.00 cm   LV E/e' lateral: 13.2 LV SV:         55 LV SV Index:   34 LVOT Area:     3.14 cm  RIGHT VENTRICLE             IVC RV S prime:     12.40 cm/s  IVC diam: 1.70 cm TAPSE (M-mode): 2.0 cm LEFT ATRIUM             Index        RIGHT ATRIUM           Index LA diam:        3.50 cm 2.17 cm/m   RA Area:     18.35 cm LA Vol (A2C):   27.6 ml 17.10 ml/m  RA Volume:   43.70 ml  27.07 ml/m LA Vol (A4C):   31.0 ml 19.21 ml/m LA Biplane Vol: 31.6 ml 19.58 ml/m  AORTIC VALVE AV Area (Vmax): 1.42 cm AV Vmax:        210.00 cm/s AV Peak Grad:   17.6 mmHg LVOT Vmax:      95.00 cm/s LVOT Vmean:     63.500 cm/s LVOT VTI:       0.176 m  AORTA Ao Root diam: 2.90 cm Ao Asc diam:  2.90 cm MITRAL VALVE                TRICUSPID VALVE MV Area (PHT): 3.65 cm     TR Peak grad:   34.3 mmHg MV Decel Time: 208 msec     TR Vmax:        293.00 cm/s MR Peak grad: 68.6 mmHg MR Vmax:      414.00 cm/s   SHUNTS MV E velocity: 91.80 cm/s   Systemic  VTI:  0.18 m MV A velocity: 122.00 cm/s  Systemic Diam: 2.00 cm MV E/A ratio:  0.75 Riley Lam MD Electronically signed by Riley Lam MD Signature Date/Time: 01/03/2023/10:57:42 AM    Final    DG CHEST PORT 1 VIEW  Result Date: 01/02/2023 CLINICAL DATA:  Acute respiratory failure. EXAM: PORTABLE CHEST 1 VIEW COMPARISON:  3 hours ago. FINDINGS: Increasing hazy lung base opacities may represent developing/increasing effusions. Patchy bibasilar airspace disease.  Stable heart size and mediastinal contours, aortic atherosclerosis. Question of nodular density in the left upper lobe again seen. No pneumothorax. IMPRESSION: 1. Increasing hazy lung base opacities may represent developing/increasing effusions. 2. Patchy bibasilar airspace disease, atelectasis versus pneumonia. 3. Question of left upper lobe nodule again seen, recommend CT. Electronically Signed   By: Narda Rutherford M.D.   On: 01/02/2023 23:07   CT HEAD WO CONTRAST ( )  Result Date: 01/02/2023 CLINICAL DATA:  Delirium EXAM: CT HEAD WITHOUT CONTRAST TECHNIQUE: Contiguous axial images were obtained from the base of the skull through the vertex without intravenous contrast. RADIATION DOSE REDUCTION: This exam was performed according to the departmental dose-optimization program which includes automated exposure control, adjustment of the mA and/or kV according to patient size and/or use of iterative reconstruction technique. COMPARISON:  None Available. FINDINGS: Brain: Diffuse cerebral atrophy. Ventricular dilatation consistent with central atrophy. Low-attenuation changes in the deep white matter consistent with small vessel ischemia. No abnormal extra-axial fluid collections. No mass effect or midline shift. Gray-white matter junctions are distinct. Basal cisterns are not effaced. No acute intracranial hemorrhage. Vascular: No hyperdense vessel or unexpected calcification. Skull: Normal. Negative for fracture or focal lesion. Sinuses/Orbits: No acute finding. Other: None. IMPRESSION: No acute intracranial abnormalities. Chronic atrophy and small vessel ischemic changes. Electronically Signed   By: Burman Nieves M.D.   On: 01/02/2023 22:50   DG Chest Port 1 View  Result Date: 01/02/2023 CLINICAL DATA:  Sepsis. EXAM: PORTABLE CHEST 1 VIEW COMPARISON:  Radiograph 03/27/2017 FINDINGS: Heart is normal in size. Aortic tortuosity and atherosclerosis. Ill-defined opacity at the right lung base peripherally  may represent airspace disease, effusion or combination there of. Question of left upper lobe nodular density. Peribronchial thickening appears chronic. No pneumothorax. IMPRESSION: 1. Ill-defined opacity at the right lung base peripherally may represent airspace disease, effusion or combination of thereof. 2. Question of left upper lobe nodular density. Chest CT is recommended for characterization. 3. Peribronchial thickening appears chronic. Electronically Signed   By: Narda Rutherford M.D.   On: 01/02/2023 20:41   (Echo, Carotid, EGD, Colonoscopy, ERCP)    Subjective: No complaints  Discharge Exam: Vitals:   01/05/23 2045 01/06/23 0600  BP: 124/64 (!) 162/74  Pulse: 76 87  Resp: 18 18  Temp: 97.6 F (36.4 C) 98.2 F (36.8 C)  SpO2: 93% 97%   Vitals:   01/05/23 0545 01/05/23 1350 01/05/23 2045 01/06/23 0600  BP: 137/89 (!) 157/85 124/64 (!) 162/74  Pulse: 90 99 76 87  Resp: 16 18 18 18   Temp: 97.8 F (36.6 C) 97.9 F (36.6 C) 97.6 F (36.4 C) 98.2 F (36.8 C)  TempSrc: Oral Oral Oral Oral  SpO2: 96% 94% 93% 97%  Weight:      Height:        General: Pt is alert, awake, not in acute distress Cardiovascular: RRR, S1/S2 +, no rubs, no gallops Respiratory: CTA bilaterally, no wheezing, no rhonchi Abdominal: Soft, NT, ND, bowel sounds + Extremities: no edema, no cyanosis    The  results of significant diagnostics from this hospitalization (including imaging, microbiology, ancillary and laboratory) are listed below for reference.     Microbiology: Recent Results (from the past 240 hour(s))  Culture, blood (Routine x 2)     Status: Abnormal   Collection Time: 01/02/23  7:49 PM   Specimen: BLOOD  Result Value Ref Range Status   Specimen Description   Final    BLOOD BLOOD LEFT FOREARM Performed at [facility], [address] [place], [place] [address]    Special Requests   Final    BOTTLES DRAWN AEROBIC AND ANAEROBIC Blood Culture adequate  volume Performed at [facility], [address] [place], [place] [address]    Culture  Setup Time   Final    GRAM NEGATIVE RODS IN BOTH AEROBIC AND ANAEROBIC BOTTLES CRITICAL RESULT CALLED TO, READ BACK BY AND VERIFIED WITH: PHARMD JUSTIN L 1302 [id number] FCP Performed at [facility], [address] [place], [place] [address]    Culture ESCHERICHIA COLI (A)  Final   Report Status 01/05/2023 FINAL  Final   Organism ID, Bacteria ESCHERICHIA COLI  Final      Susceptibility   Escherichia coli - MIC*    AMPICILLIN 4 SENSITIVE Sensitive     CEFEPIME <=0.12 SENSITIVE Sensitive     CEFTAZIDIME <=1 SENSITIVE Sensitive     CEFTRIAXONE <=0.25 SENSITIVE Sensitive     CIPROFLOXACIN <=0.25 SENSITIVE Sensitive     GENTAMICIN <=1 SENSITIVE Sensitive     IMIPENEM <=0.25 SENSITIVE Sensitive     TRIMETH/SULFA <=20 SENSITIVE Sensitive     AMPICILLIN/SULBACTAM <=2 SENSITIVE Sensitive     PIP/TAZO <=4 SENSITIVE Sensitive     * ESCHERICHIA COLI  Blood Culture ID Panel (Reflexed)     Status: Abnormal   Collection Time: 01/02/23  7:49 PM  Result Value Ref Range Status   Enterococcus faecalis NOT DETECTED NOT DETECTED Final   Enterococcus Faecium NOT DETECTED NOT DETECTED Final   Listeria monocytogenes NOT DETECTED NOT DETECTED Final   Staphylococcus species NOT DETECTED NOT DETECTED Final   Staphylococcus aureus (BCID) NOT DETECTED NOT DETECTED Final   Staphylococcus epidermidis NOT DETECTED NOT DETECTED Final   Staphylococcus lugdunensis NOT DETECTED NOT DETECTED Final   Streptococcus species NOT DETECTED NOT DETECTED Final   Streptococcus agalactiae NOT DETECTED NOT DETECTED Final   Streptococcus pneumoniae NOT DETECTED NOT DETECTED Final   Streptococcus pyogenes NOT DETECTED NOT DETECTED Final   A.calcoaceticus-baumannii NOT DETECTED NOT DETECTED Final   Bacteroides fragilis NOT DETECTED NOT DETECTED Final   Enterobacterales DETECTED (A) NOT DETECTED Final    Comment:  Enterobacterales represent a large order of gram negative bacteria, not a single organism. CRITICAL RESULT CALLED TO, READ BACK BY AND VERIFIED WITH: PHARMD JUSTIN L 1302 [id number] FCP    Enterobacter cloacae complex NOT DETECTED NOT DETECTED Final   Escherichia coli DETECTED (A) NOT DETECTED Final    Comment: CRITICAL RESULT CALLED TO, READ BACK BY AND VERIFIED WITH: PHARMD JUSTIN L 1302 [id number] FCP    Klebsiella aerogenes NOT DETECTED NOT DETECTED Final   Klebsiella oxytoca NOT DETECTED NOT DETECTED Final   Klebsiella pneumoniae NOT DETECTED NOT DETECTED Final   Proteus species NOT DETECTED NOT DETECTED Final   Salmonella species NOT DETECTED NOT DETECTED Final   Serratia marcescens NOT DETECTED NOT DETECTED Final   Haemophilus influenzae NOT DETECTED NOT DETECTED Final   Neisseria meningitidis NOT DETECTED NOT DETECTED Final   Pseudomonas aeruginosa NOT DETECTED  NOT DETECTED Final   Stenotrophomonas maltophilia NOT DETECTED NOT DETECTED Final   Candida albicans NOT DETECTED NOT DETECTED Final   Candida auris NOT DETECTED NOT DETECTED Final   Candida glabrata NOT DETECTED NOT DETECTED Final   Candida krusei NOT DETECTED NOT DETECTED Final   Candida parapsilosis NOT DETECTED NOT DETECTED Final   Candida tropicalis NOT DETECTED NOT DETECTED Final   Cryptococcus neoformans/gattii NOT DETECTED NOT DETECTED Final   CTX-M ESBL NOT DETECTED NOT DETECTED Final   Carbapenem resistance IMP NOT DETECTED NOT DETECTED Final   Carbapenem resistance KPC NOT DETECTED NOT DETECTED Final   Carbapenem resistance NDM NOT DETECTED NOT DETECTED Final   Carbapenem resist OXA 48 LIKE NOT DETECTED NOT DETECTED Final   Carbapenem resistance VIM NOT DETECTED NOT DETECTED Final    Comment: Performed at [facility], [address] [place], [place] [address]  Resp panel by RT-PCR (RSV, Flu A&B, Covid) Anterior Nasal Swab     Status: None   Collection Time: 01/02/23  9:56 PM   Specimen: Anterior Nasal Swab   Result Value Ref Range Status   SARS Coronavirus 2 by RT PCR NEGATIVE NEGATIVE Final    Comment: (NOTE) SARS-CoV-2 target nucleic acids are NOT DETECTED.  The SARS-CoV-2 RNA is generally detectable in upper respiratory specimens during the acute phase of infection. The lowest concentration of SARS-CoV-2 viral copies this assay can detect is 138 copies/mL. A negative result does not preclude SARS-Cov-2 infection and should not be used as the sole basis for treatment or other patient management decisions. A negative result may occur with  improper specimen collection/handling, submission of specimen other than nasopharyngeal swab, presence of viral mutation(s) within the areas targeted by this assay, and inadequate number of viral copies(<138 copies/mL). A negative result must be combined with clinical observations, patient history, and epidemiological information. The expected result is Negative.  Fact Sheet for Patients:  BloggerCourse.com  Fact Sheet for Healthcare Providers:  SeriousBroker.it  This test is no t yet approved or cleared by the [place] FDA and  has been authorized for detection and/or diagnosis of SARS-CoV-2 by FDA under an Emergency Use Authorization (EUA). This EUA will remain  in effect (meaning this test can be used) for the duration of the COVID-19 declaration under Section 564(b)(1) of the Act, 21 U.S.C.section 360bbb-3(b)(1), unless the authorization is terminated  or revoked sooner.       Influenza A by PCR NEGATIVE NEGATIVE Final   Influenza B by PCR NEGATIVE NEGATIVE Final    Comment: (NOTE) The Xpert Xpress SARS-CoV-2/FLU/RSV plus assay is intended as an aid in the diagnosis of influenza from Nasopharyngeal swab specimens and should not be used as a sole basis for treatment. Nasal washings and aspirates are unacceptable for Xpert Xpress SARS-CoV-2/FLU/RSV testing.  Fact Sheet for  Patients: BloggerCourse.com  Fact Sheet for Healthcare Providers: SeriousBroker.it  This test is not yet approved or cleared by the [place] FDA and has been authorized for detection and/or diagnosis of SARS-CoV-2 by FDA under an Emergency Use Authorization (EUA). This EUA will remain in effect (meaning this test can be used) for the duration of the COVID-19 declaration under Section 564(b)(1) of the Act, 21 U.S.C. section 360bbb-3(b)(1), unless the authorization is terminated or revoked.     Resp Syncytial Virus by PCR NEGATIVE NEGATIVE Final    Comment: (NOTE) Fact Sheet for Patients: BloggerCourse.com  Fact Sheet for Healthcare Providers: SeriousBroker.it  This test is not yet approved or cleared  by the [place] and has been authorized for detection and/or diagnosis of SARS-CoV-2 by FDA under an Emergency Use Authorization (EUA). This EUA will remain in effect (meaning this test can be used) for the duration of the COVID-19 declaration under Section 564(b)(1) of the Act, 21 U.S.C. section 360bbb-3(b)(1), unless the authorization is terminated or revoked.  Performed at [facility], [address] [place], [place] [address]   Respiratory (~20 pathogens) panel by PCR     Status: None   Collection Time: 01/02/23  9:56 PM   Specimen: Nasopharyngeal Swab; Respiratory  Result Value Ref Range Status   Adenovirus NOT DETECTED NOT DETECTED Final   Coronavirus 229E NOT DETECTED NOT DETECTED Final    Comment: (NOTE) The Coronavirus on the Respiratory Panel, DOES NOT test for the novel  Coronavirus (2019 nCoV)    Coronavirus HKU1 NOT DETECTED NOT DETECTED Final   Coronavirus NL63 NOT DETECTED NOT DETECTED Final   Coronavirus OC43 NOT DETECTED NOT DETECTED Final   Metapneumovirus NOT DETECTED NOT DETECTED Final   Rhinovirus / Enterovirus NOT  DETECTED NOT DETECTED Final   Influenza A NOT DETECTED NOT DETECTED Final   Influenza B NOT DETECTED NOT DETECTED Final   Parainfluenza Virus 1 NOT DETECTED NOT DETECTED Final   Parainfluenza Virus 2 NOT DETECTED NOT DETECTED Final   Parainfluenza Virus 3 NOT DETECTED NOT DETECTED Final   Parainfluenza Virus 4 NOT DETECTED NOT DETECTED Final   Respiratory Syncytial Virus NOT DETECTED NOT DETECTED Final   Bordetella pertussis NOT DETECTED NOT DETECTED Final   Bordetella Parapertussis NOT DETECTED NOT DETECTED Final   Chlamydophila pneumoniae NOT DETECTED NOT DETECTED Final   Mycoplasma pneumoniae NOT DETECTED NOT DETECTED Final    Comment: Performed at [facility], [address] [place], [place] [address]  Urine Culture (for pregnant, neutropenic or urologic patients or patients with an indwelling urinary catheter)     Status: Abnormal   Collection Time: 01/02/23 11:06 PM   Specimen: Urine, Clean Catch  Result Value Ref Range Status   Specimen Description   Final    URINE, CLEAN CATCH Performed at [facility], [address] [place], [place] [address]    Special Requests   Final    NONE Performed at [facility], [address], [place], [place] [address]    Culture 50,000 COLONIES/mL ESCHERICHIA COLI (A)  Final   Report Status 01/04/2023 FINAL  Final   Organism ID, Bacteria ESCHERICHIA COLI (A)  Final      Susceptibility   Escherichia coli - MIC*    AMPICILLIN <=2 SENSITIVE Sensitive     CEFAZOLIN <=4 SENSITIVE Sensitive     CEFEPIME <=0.12 SENSITIVE Sensitive     CEFTRIAXONE <=0.25 SENSITIVE Sensitive     CIPROFLOXACIN <=0.25 SENSITIVE Sensitive     GENTAMICIN <=1 SENSITIVE Sensitive     IMIPENEM <=0.25 SENSITIVE Sensitive     NITROFURANTOIN <=16 SENSITIVE Sensitive     TRIMETH/SULFA <=20 SENSITIVE Sensitive     AMPICILLIN/SULBACTAM <=2 SENSITIVE Sensitive     PIP/TAZO <=4 SENSITIVE Sensitive     * 50,000 COLONIES/mL  ESCHERICHIA COLI  MRSA Next Gen by PCR, Nasal     Status: None   Collection Time: 01/03/23 12:43 AM   Specimen: Nasal Mucosa; Nasal Swab  Result Value Ref Range Status   MRSA by PCR Next Gen NOT DETECTED NOT DETECTED Final    Comment: (NOTE) The GeneXpert MRSA Assay (FDA approved for  NASAL specimens only), is one component of a comprehensive MRSA colonization surveillance program. It is not intended to diagnose MRSA infection nor to guide or monitor treatment for MRSA infections. Test performance is not FDA approved in patients less than 54 years old. Performed at [facility], [address], [place], [place] [address]   Culture, blood (Routine x 2)     Status: None (Preliminary result)   Collection Time: 01/03/23  2:17 AM   Specimen: BLOOD RIGHT ARM  Result Value Ref Range Status   Specimen Description   Final    BLOOD RIGHT ARM Performed at [facility], [address], [place], [place] [address]    Special Requests   Final    BOTTLES DRAWN AEROBIC AND ANAEROBIC Blood Culture adequate volume Performed at [facility], [address] [address], [place], [place] [address]    Culture   Final    NO GROWTH 2 DAYS Performed at [facility], [address], [place], [place] [address]    Report Status PENDING  Incomplete     Labs: BNP (last 3 results) No results for input(s): "BNP" in the last 8760 hours. Basic Metabolic Panel: Recent Labs  Lab 01/02/23 1949 01/02/23 2215 01/03/23 0217 01/04/23 0304 01/05/23 0349 01/06/23 0416  NA 131*  --  130* 135 136 135  K 3.6  --  3.4* 2.9* 4.4 3.5  CL 92*  --  94* 100 104 100  CO2 24  --  23 24 24 27   GLUCOSE 139*  --  151* 120* 99 113*  BUN 41*  --  42* 37* 28* 22  CREATININE 2.32*  --  2.03* 1.28* 1.00 0.91  CALCIUM 9.3  --  8.5* 8.3* 8.3* 8.3*  MG  --  1.5* 2.5*  --   --   --   PHOS  --  4.2 4.1  --   --   --    Liver Function Tests: Recent Labs  Lab 01/02/23 1949 01/03/23 0217   AST 51* 50*  ALT 26 26  ALKPHOS 51 49  BILITOT 0.5 0.7  PROT 7.0 5.4*  ALBUMIN 3.3* 2.6*   No results for input(s): "LIPASE", "AMYLASE" in the last 168 hours. Recent Labs  Lab 01/02/23 2215  AMMONIA 19   CBC: Recent Labs  Lab 01/02/23 1949 01/03/23 0217  WBC 17.5* 12.9*  NEUTROABS 15.6*  --   HGB 13.3 11.1*  HCT 38.7 32.1*  MCV 91.9 92.0  PLT 172 144*   Cardiac Enzymes: Recent Labs  Lab 01/02/23 2215 01/03/23 0217 01/04/23 0304  CKTOTAL 870* 805* 370*   BNP: Invalid input(s): "POCBNP" CBG: Recent Labs  Lab 01/04/23 0108 01/04/23 0454 01/04/23 0840 01/04/23 1122 01/04/23 1657  GLUCAP 108* 110* 106* 123* 134*   D-Dimer No results for input(s): "DDIMER" in the last 72 hours. Hgb A1c No results for input(s): "HGBA1C" in the last 72 hours. Lipid Profile No results for input(s): "CHOL", "HDL", "LDLCALC", "TRIG", "CHOLHDL", "LDLDIRECT" in the last 72 hours. Thyroid function studies No results for input(s): "TSH", "T4TOTAL", "T3FREE", "THYROIDAB" in the last 72 hours.  Invalid input(s): "FREET3" Anemia work up No results for input(s): "VITAMINB12", "FOLATE", "FERRITIN", "TIBC", "IRON", "RETICCTPCT" in the last 72 hours. Urinalysis    Component Value Date/Time   COLORURINE AMBER (A) 01/02/2023 2306   APPEARANCEUR CLOUDY (A) 01/02/2023 2306   LABSPEC 1.015 01/02/2023 2306   PHURINE 5.0 01/02/2023 2306   GLUCOSEU NEGATIVE 01/02/2023 2306  HGBUR MODERATE (A) 01/02/2023 2306   BILIRUBINUR NEGATIVE 01/02/2023 2306   KETONESUR NEGATIVE 01/02/2023 2306   PROTEINUR >=300 (A) 01/02/2023 2306   NITRITE NEGATIVE 01/02/2023 2306   LEUKOCYTESUR LARGE (A) 01/02/2023 2306   Sepsis Labs Recent Labs  Lab 01/02/23 1949 01/03/23 0217  WBC 17.5* 12.9*   Microbiology Recent Results (from the past 240 hour(s))  Culture, blood (Routine x 2)     Status: Abnormal   Collection Time: 01/02/23  7:49 PM   Specimen: BLOOD  Result Value Ref Range Status   Specimen  Description   Final    BLOOD BLOOD LEFT FOREARM Performed at [facility], [address] [place], [place] [address]    Special Requests   Final    BOTTLES DRAWN AEROBIC AND ANAEROBIC Blood Culture adequate volume Performed at [facility], [address], [place], [place] [address]    Culture  Setup Time   Final    GRAM NEGATIVE RODS IN BOTH AEROBIC AND ANAEROBIC BOTTLES CRITICAL RESULT CALLED TO, READ BACK BY AND VERIFIED WITH: PHARMD JUSTIN L 1302 [id number] FCP Performed at [facility], [address] [place], [place] [address]    Culture ESCHERICHIA COLI (A)  Final   Report Status 01/05/2023 FINAL  Final   Organism ID, Bacteria ESCHERICHIA COLI  Final      Susceptibility   Escherichia coli - MIC*    AMPICILLIN 4 SENSITIVE Sensitive     CEFEPIME <=0.12 SENSITIVE Sensitive     CEFTAZIDIME <=1 SENSITIVE Sensitive     CEFTRIAXONE <=0.25 SENSITIVE Sensitive     CIPROFLOXACIN <=0.25 SENSITIVE Sensitive     GENTAMICIN <=1 SENSITIVE Sensitive     IMIPENEM <=0.25 SENSITIVE Sensitive     TRIMETH/SULFA <=20 SENSITIVE Sensitive     AMPICILLIN/SULBACTAM <=2 SENSITIVE Sensitive     PIP/TAZO <=4 SENSITIVE Sensitive     * ESCHERICHIA COLI  Blood Culture ID Panel (Reflexed)     Status: Abnormal   Collection Time: 01/02/23  7:49 PM  Result Value Ref Range Status   Enterococcus faecalis NOT DETECTED NOT DETECTED Final   Enterococcus Faecium NOT DETECTED NOT DETECTED Final   Listeria monocytogenes NOT DETECTED NOT DETECTED Final   Staphylococcus species NOT DETECTED NOT DETECTED Final   Staphylococcus aureus (BCID) NOT DETECTED NOT DETECTED Final   Staphylococcus epidermidis NOT DETECTED NOT DETECTED Final   Staphylococcus lugdunensis NOT DETECTED NOT DETECTED Final   Streptococcus species NOT DETECTED NOT DETECTED Final   Streptococcus agalactiae NOT DETECTED NOT DETECTED Final   Streptococcus pneumoniae NOT DETECTED NOT DETECTED Final    Streptococcus pyogenes NOT DETECTED NOT DETECTED Final   A.calcoaceticus-baumannii NOT DETECTED NOT DETECTED Final   Bacteroides fragilis NOT DETECTED NOT DETECTED Final   Enterobacterales DETECTED (A) NOT DETECTED Final    Comment: Enterobacterales represent a large order of gram negative bacteria, not a single organism. CRITICAL RESULT CALLED TO, READ BACK BY AND VERIFIED WITH: PHARMD JUSTIN L 1302 [id number] FCP    Enterobacter cloacae complex NOT DETECTED NOT DETECTED Final   Escherichia coli DETECTED (A) NOT DETECTED Final    Comment: CRITICAL RESULT CALLED TO, READ BACK BY AND VERIFIED WITH: PHARMD JUSTIN L 1302 [id number] FCP    Klebsiella aerogenes NOT DETECTED NOT DETECTED Final   Klebsiella oxytoca NOT DETECTED NOT DETECTED Final   Klebsiella pneumoniae NOT DETECTED NOT DETECTED Final   Proteus species NOT DETECTED NOT DETECTED Final   Salmonella species NOT DETECTED NOT DETECTED Final  Serratia marcescens NOT DETECTED NOT DETECTED Final   Haemophilus influenzae NOT DETECTED NOT DETECTED Final   Neisseria meningitidis NOT DETECTED NOT DETECTED Final   Pseudomonas aeruginosa NOT DETECTED NOT DETECTED Final   Stenotrophomonas maltophilia NOT DETECTED NOT DETECTED Final   Candida albicans NOT DETECTED NOT DETECTED Final   Candida auris NOT DETECTED NOT DETECTED Final   Candida glabrata NOT DETECTED NOT DETECTED Final   Candida krusei NOT DETECTED NOT DETECTED Final   Candida parapsilosis NOT DETECTED NOT DETECTED Final   Candida tropicalis NOT DETECTED NOT DETECTED Final   Cryptococcus neoformans/gattii NOT DETECTED NOT DETECTED Final   CTX-M ESBL NOT DETECTED NOT DETECTED Final   Carbapenem resistance IMP NOT DETECTED NOT DETECTED Final   Carbapenem resistance KPC NOT DETECTED NOT DETECTED Final   Carbapenem resistance NDM NOT DETECTED NOT DETECTED Final   Carbapenem resist OXA 48 LIKE NOT DETECTED NOT DETECTED Final   Carbapenem resistance VIM NOT DETECTED NOT DETECTED Final     Comment: Performed at [facility], [address], [place], [place] [address]  Resp panel by RT-PCR (RSV, Flu A&B, Covid) Anterior Nasal Swab     Status: None   Collection Time: 01/02/23  9:56 PM   Specimen: Anterior Nasal Swab  Result Value Ref Range Status   SARS Coronavirus 2 by RT PCR NEGATIVE NEGATIVE Final    Comment: (NOTE) SARS-CoV-2 target nucleic acids are NOT DETECTED.  The SARS-CoV-2 RNA is generally detectable in upper respiratory specimens during the acute phase of infection. The lowest concentration of SARS-CoV-2 viral copies this assay can detect is 138 copies/mL. A negative result does not preclude SARS-Cov-2 infection and should not be used as the sole basis for treatment or other patient management decisions. A negative result may occur with  improper specimen collection/handling, submission of specimen other than nasopharyngeal swab, presence of viral mutation(s) within the areas targeted by this assay, and inadequate number of viral copies(<138 copies/mL). A negative result must be combined with clinical observations, patient history, and epidemiological information. The expected result is Negative.  Fact Sheet for Patients:  BloggerCourse.com  Fact Sheet for Healthcare Providers:  SeriousBroker.it  This test is no t yet approved or cleared by the [place] FDA and  has been authorized for detection and/or diagnosis of SARS-CoV-2 by FDA under an Emergency Use Authorization (EUA). This EUA will remain  in effect (meaning this test can be used) for the duration of the COVID-19 declaration under Section 564(b)(1) of the Act, 21 U.S.C.section 360bbb-3(b)(1), unless the authorization is terminated  or revoked sooner.       Influenza A by PCR NEGATIVE NEGATIVE Final   Influenza B by PCR NEGATIVE NEGATIVE Final    Comment: (NOTE) The Xpert Xpress SARS-CoV-2/FLU/RSV plus assay is intended as an aid in  the diagnosis of influenza from Nasopharyngeal swab specimens and should not be used as a sole basis for treatment. Nasal washings and aspirates are unacceptable for Xpert Xpress SARS-CoV-2/FLU/RSV testing.  Fact Sheet for Patients: BloggerCourse.com  Fact Sheet for Healthcare Providers: SeriousBroker.it  This test is not yet approved or cleared by the [place] FDA and has been authorized for detection and/or diagnosis of SARS-CoV-2 by FDA under an Emergency Use Authorization (EUA). This EUA will remain in effect (meaning this test can be used) for the duration of the COVID-19 declaration under Section 564(b)(1) of the Act, 21 U.S.C. section 360bbb-3(b)(1), unless the authorization is terminated or revoked.     Resp Syncytial Virus  by PCR NEGATIVE NEGATIVE Final    Comment: (NOTE) Fact Sheet for Patients: BloggerCourse.com  Fact Sheet for Healthcare Providers: SeriousBroker.it  This test is not yet approved or cleared by the [place] FDA and has been authorized for detection and/or diagnosis of SARS-CoV-2 by FDA under an Emergency Use Authorization (EUA). This EUA will remain in effect (meaning this test can be used) for the duration of the COVID-19 declaration under Section 564(b)(1) of the Act, 21 U.S.C. section 360bbb-3(b)(1), unless the authorization is terminated or revoked.  Performed at [facility], [address], [place], [place] [address]   Respiratory (~20 pathogens) panel by PCR     Status: None   Collection Time: 01/02/23  9:56 PM   Specimen: Nasopharyngeal Swab; Respiratory  Result Value Ref Range Status   Adenovirus NOT DETECTED NOT DETECTED Final   Coronavirus 229E NOT DETECTED NOT DETECTED Final    Comment: (NOTE) The Coronavirus on the Respiratory Panel, DOES NOT test for the novel  Coronavirus (2019 nCoV)    Coronavirus  HKU1 NOT DETECTED NOT DETECTED Final   Coronavirus NL63 NOT DETECTED NOT DETECTED Final   Coronavirus OC43 NOT DETECTED NOT DETECTED Final   Metapneumovirus NOT DETECTED NOT DETECTED Final   Rhinovirus / Enterovirus NOT DETECTED NOT DETECTED Final   Influenza A NOT DETECTED NOT DETECTED Final   Influenza B NOT DETECTED NOT DETECTED Final   Parainfluenza Virus 1 NOT DETECTED NOT DETECTED Final   Parainfluenza Virus 2 NOT DETECTED NOT DETECTED Final   Parainfluenza Virus 3 NOT DETECTED NOT DETECTED Final   Parainfluenza Virus 4 NOT DETECTED NOT DETECTED Final   Respiratory Syncytial Virus NOT DETECTED NOT DETECTED Final   Bordetella pertussis NOT DETECTED NOT DETECTED Final   Bordetella Parapertussis NOT DETECTED NOT DETECTED Final   Chlamydophila pneumoniae NOT DETECTED NOT DETECTED Final   Mycoplasma pneumoniae NOT DETECTED NOT DETECTED Final    Comment: Performed at [facility], [address] [place], [place] [address]  Urine Culture (for pregnant, neutropenic or urologic patients or patients with an indwelling urinary catheter)     Status: Abnormal   Collection Time: 01/02/23 11:06 PM   Specimen: Urine, Clean Catch  Result Value Ref Range Status   Specimen Description   Final    URINE, CLEAN CATCH Performed at [facility], [address] [place], [place] [address]    Special Requests   Final    NONE Performed at [facility], [address] [place], [place] [address]    Culture 50,000 COLONIES/mL ESCHERICHIA COLI (A)  Final   Report Status 01/04/2023 FINAL  Final   Organism ID, Bacteria ESCHERICHIA COLI (A)  Final      Susceptibility   Escherichia coli - MIC*    AMPICILLIN <=2 SENSITIVE Sensitive     CEFAZOLIN <=4 SENSITIVE Sensitive     CEFEPIME <=0.12 SENSITIVE Sensitive     CEFTRIAXONE <=0.25 SENSITIVE Sensitive     CIPROFLOXACIN <=0.25 SENSITIVE Sensitive     GENTAMICIN <=1 SENSITIVE Sensitive     IMIPENEM <=0.25  SENSITIVE Sensitive     NITROFURANTOIN <=16 SENSITIVE Sensitive     TRIMETH/SULFA <=20 SENSITIVE Sensitive     AMPICILLIN/SULBACTAM <=2 SENSITIVE Sensitive     PIP/TAZO <=4 SENSITIVE Sensitive     * 50,000 COLONIES/mL ESCHERICHIA COLI  MRSA Next Gen by PCR, Nasal     Status: None   Collection Time: 01/03/23 12:43 AM   Specimen: Nasal Mucosa; Nasal  Swab  Result Value Ref Range Status   MRSA by PCR Next Gen NOT DETECTED NOT DETECTED Final    Comment: (NOTE) The GeneXpert MRSA Assay (FDA approved for NASAL specimens only), is one component of a comprehensive MRSA colonization surveillance program. It is not intended to diagnose MRSA infection nor to guide or monitor treatment for MRSA infections. Test performance is not FDA approved in patients less than 55 years old. Performed at [facility], [address], [place], [place] [address]   Culture, blood (Routine x 2)     Status: None (Preliminary result)   Collection Time: 01/03/23  2:17 AM   Specimen: BLOOD RIGHT ARM  Result Value Ref Range Status   Specimen Description   Final    BLOOD RIGHT ARM Performed at [facility], [address], [place], [place] [address]    Special Requests   Final    BOTTLES DRAWN AEROBIC AND ANAEROBIC Blood Culture adequate volume Performed at [facility], [address], [place], [place] [address]    Culture   Final    NO GROWTH 2 DAYS Performed at [facility], [address], [place], [place] [address]    Report Status PENDING  Incomplete     SIGNED:   Marinda Elk, MD  [facility] 01/06/2023, 7:44 AM Pager   If 7PM-7AM, please contact night-coverage [email or web address] Password [email or web address]

## 2023-01-06 NOTE — TOC Transition Note (Signed)
Transition of Care [facility]) - CM/SW Discharge Note   Patient Details  Name: Diamond Holland MRN: [id number] Date of Birth: 06/15/1927  [facility] of [facility] CM/SW Contact:  Otelia Santee, LCSW Phone Number: 01/06/2023, 10:24 AM   Clinical Narrative:    Spoke with pt's daughter to discuss home health. Pt's daughter shares that pt lives with her and she cares for all of pt's ADL's, cooking, etc. She is unsure whether or not she thinks pt needs home health services. She is agreeable to having home health arranged and plans to evaluate how pt does once she returns home.  HHPT has been arranged with with [facility]. Pt's daughter is to provide transportation for pt at discharge.    Final next level of care: Home w Home Health Services Barriers to Discharge: Barriers Resolved   Patient Goals and CMS Choice CMS Medicare.gov Compare Post Acute Care list provided to:: Patient Represenative (must comment) Choice offered to / list presented to : Adult Children  Discharge Placement                         Discharge Plan and Services Additional resources added to the After Visit Summary for   In-house Referral: Clinical Social Work   Post Acute Care Choice: Home Health          DME Arranged: N/A DME Agency: NA       HH Arranged: PT, OT HH Agency: [facility] Date HH Agency Contacted: 01/06/23 Time HH Agency Contacted: 1024 Representative spoke with at [facility] Agency: Haywood Lasso  Social Determinants of Health (SDOH) Interventions SDOH Screenings   Food Insecurity: No Food Insecurity (01/04/2023)  Housing: Low Risk  (01/04/2023)  Transportation Needs: No Transportation Needs (01/04/2023)  Utilities: Not At Risk (01/04/2023)  Tobacco Use: Medium Risk (01/03/2023)     Readmission Risk Interventions    01/06/2023   10:24 AM  Readmission Risk Prevention Plan  Post Dischage Appt Complete  Medication Screening Complete  Transportation Screening Complete

## 2023-01-08 LAB — CULTURE, BLOOD (ROUTINE X 2)
Culture: NO GROWTH
Special Requests: ADEQUATE

## 2023-02-02 ENCOUNTER — Emergency Department (HOSPITAL_COMMUNITY): Payer: Medicare HMO

## 2023-02-02 ENCOUNTER — Encounter (HOSPITAL_COMMUNITY): Payer: Self-pay

## 2023-02-02 ENCOUNTER — Emergency Department (HOSPITAL_COMMUNITY)
Admission: EM | Admit: 2023-02-02 | Discharge: 2023-02-02 | Disposition: A | Discharge status: Home / Self Care | Payer: Medicare HMO | Attending: Emergency Medicine | Admitting: Emergency Medicine

## 2023-02-02 ENCOUNTER — Other Ambulatory Visit: Payer: Self-pay

## 2023-02-02 DIAGNOSIS — J449 Chronic obstructive pulmonary disease, unspecified: Secondary | ICD-10-CM | POA: Insufficient documentation

## 2023-02-02 DIAGNOSIS — W19XXXA Unspecified fall, initial encounter: Secondary | ICD-10-CM

## 2023-02-02 DIAGNOSIS — S0083XA Contusion of other part of head, initial encounter: Secondary | ICD-10-CM | POA: Insufficient documentation

## 2023-02-02 DIAGNOSIS — J45909 Unspecified asthma, uncomplicated: Secondary | ICD-10-CM | POA: Insufficient documentation

## 2023-02-02 DIAGNOSIS — Z7951 Long term (current) use of inhaled steroids: Secondary | ICD-10-CM | POA: Diagnosis not present

## 2023-02-02 DIAGNOSIS — E119 Type 2 diabetes mellitus without complications: Secondary | ICD-10-CM | POA: Diagnosis not present

## 2023-02-02 DIAGNOSIS — S0990XA Unspecified injury of head, initial encounter: Secondary | ICD-10-CM | POA: Diagnosis present

## 2023-02-02 DIAGNOSIS — W01198A Fall on same level from slipping, tripping and stumbling with subsequent striking against other object, initial encounter: Secondary | ICD-10-CM | POA: Diagnosis not present

## 2023-02-02 DIAGNOSIS — R918 Other nonspecific abnormal finding of lung field: Secondary | ICD-10-CM | POA: Diagnosis not present

## 2023-02-02 DIAGNOSIS — Y92000 Kitchen of unspecified non-institutional (private) residence as  the place of occurrence of the external cause: Secondary | ICD-10-CM | POA: Insufficient documentation

## 2023-02-02 DIAGNOSIS — Z7982 Long term (current) use of aspirin: Secondary | ICD-10-CM | POA: Diagnosis not present

## 2023-02-02 LAB — COMPREHENSIVE METABOLIC PANEL
ALT: 14 U/L (ref 0–44)
AST: 18 U/L (ref 15–41)
Albumin: 3.6 g/dL (ref 3.5–5.0)
Alkaline Phosphatase: 45 U/L (ref 38–126)
Anion gap: 10 (ref 5–15)
BUN: 19 mg/dL (ref 8–23)
CO2: 29 mmol/L (ref 22–32)
Calcium: 9.9 mg/dL (ref 8.9–10.3)
Chloride: 94 mmol/L — ABNORMAL LOW (ref 98–111)
Creatinine, Ser: 1.18 mg/dL — ABNORMAL HIGH (ref 0.44–1.00)
GFR, Estimated: 43 mL/min — ABNORMAL LOW (ref >60)
Glucose, Bld: 141 mg/dL — ABNORMAL HIGH (ref 70–99)
Potassium: 2.9 mmol/L — ABNORMAL LOW (ref 3.5–5.1)
Sodium: 133 mmol/L — ABNORMAL LOW (ref 135–145)
Total Bilirubin: 0.5 mg/dL (ref 0.3–1.2)
Total Protein: 7.2 g/dL (ref 6.5–8.1)

## 2023-02-02 LAB — CBC WITH DIFFERENTIAL/PLATELET
Abs Immature Granulocytes: 0.05 10*3/uL (ref 0.00–0.07)
Basophils Absolute: 0.1 10*3/uL (ref 0.0–0.1)
Basophils Relative: 1 %
Eosinophils Absolute: 0.1 10*3/uL (ref 0.0–0.5)
Eosinophils Relative: 1 %
HCT: 36.9 % (ref 36.0–46.0)
Hemoglobin: 12 g/dL (ref 12.0–15.0)
Immature Granulocytes: 1 %
Lymphocytes Relative: 12 %
Lymphs Abs: 1.2 10*3/uL (ref 0.7–4.0)
MCH: 30.3 pg (ref 26.0–34.0)
MCHC: 32.5 g/dL (ref 30.0–36.0)
MCV: 93.2 fL (ref 80.0–100.0)
Monocytes Absolute: 1 10*3/uL (ref 0.1–1.0)
Monocytes Relative: 10 %
Neutro Abs: 7.8 10*3/uL — ABNORMAL HIGH (ref 1.7–7.7)
Neutrophils Relative %: 75 %
Platelets: 313 10*3/uL (ref 150–400)
RBC: 3.96 MIL/uL (ref 3.87–5.11)
RDW: 13.2 % (ref 11.5–15.5)
WBC: 10.2 10*3/uL (ref 4.0–10.5)
nRBC: 0 % (ref 0.0–0.2)

## 2023-02-02 MED ORDER — IOHEXOL 300 MG/ML  SOLN
75.0000 mL | Freq: Once | INTRAMUSCULAR | Status: AC | PRN
  Administered 2023-02-02: 75 mL via INTRAVENOUS

## 2023-02-02 MED ORDER — OXYCODONE-ACETAMINOPHEN 5-325 MG PO TABS: 1.0000 | ORAL_TABLET | Freq: Four times a day (QID) | ORAL | 0 refills | Status: DC | PRN

## 2023-02-02 MED ORDER — ACETAMINOPHEN 325 MG PO TABS
650.0000 mg | ORAL_TABLET | Freq: Once | ORAL | Status: AC
  Administered 2023-02-02: 650 mg via ORAL
  Filled 2023-02-02: qty 2

## 2023-02-02 MED ORDER — OXYCODONE-ACETAMINOPHEN 5-325 MG PO TABS: 1.0000 | ORAL_TABLET | Freq: Four times a day (QID) | ORAL | 0 refills | Status: AC | PRN

## 2023-02-02 NOTE — ED Provider Notes (Cosign Needed Addendum)
[place] [facility]GENC[facility] DEPARTMENT AT [facility] Provider Note   CSN: [id number] Arrival date & time: 02/02/23  1420     History  Chief Complaint  Patient presents with   Diamond Holland    Diamond Holland is a 89 y.o. female.  89 y.o female with a PMH of COPD, Asthma, DM presents to the ED with a chief complaint of fall x 2 days ago.  Patient was in her kitchen 2 days ago, when suddenly she began to try to open the door as her daughter was also trying to open the door therefore she fell on her right side, reports pain to the area exacerbated with any palpation along with rotation and movement.  She has taken some over-the-counter medication without any improvement in symptoms.  She denies any pain in her head, no chest pain, no shortness of breath, no loss of consciousness.  The history is provided by the patient.  Fall Pertinent negatives include no chest pain, no abdominal pain and no shortness of breath.       Home Medications Prior to Admission medications   Medication Sig Start Date End Date Taking? Authorizing Provider  albuterol (PROVENTIL HFA;VENTOLIN HFA) 108 (90 BASE) MCG/ACT inhaler Inhale 2 puffs into the lungs every 4 (four) hours as needed for wheezing or shortness of breath (cough, shortness of breath or wheezing.). Patient not taking: Reported on 01/02/2023 05/31/15   [name], MD  amoxicillin (AMOXIL) 500 MG capsule Take 1 capsule (500 mg total) by mouth 3 (three) times daily. 01/06/23   Marinda Elk, MD  aspirin EC 81 MG tablet Take 81 mg by mouth daily. Swallow whole.    [provider]  Calcium Citrate-Vitamin D (CALCIUM CITRATE + D PO) Take 1 tablet by mouth daily.    [provider]  diphenhydrAMINE (BENADRYL) 25 mg capsule Take 25 mg by mouth every 6 (six) hours as needed for itching, allergies or sleep.    [provider]  Glucosamine-Chondroitin-MSM 375-300-237.5 MG CAPS Take 1 capsule by mouth daily.    [provider]  LIVALO 2 MG TABS Take 2 mg by mouth in the morning.    [provider]  losartan (COZAAR) 50 MG tablet Take 50 mg by mouth at bedtime.    [provider]  Multiple Vitamins-Minerals (MULTI FOR HER 50+) TABS Take 1 tablet by mouth daily with breakfast.    [provider]  Omega-3 Fatty Acids (OMEGA 3 500 PO) Take 500 mg by mouth daily.    [provider]  pantoprazole (PROTONIX) 20 MG tablet Take 20 mg by mouth daily before breakfast.    [provider]  Phenylephrine-DM-GG-APAP (SEVERE COLD & FLU PO) Take 1 capsule by mouth every 4 (four) hours as needed (for COPD symptoms).    [provider]  predniSONE (DELTASONE) 20 MG tablet Take 2 pills daily for 3 days, then 1 daily for 3 day, then 1/2 daily for 4 days Patient not taking: Reported on 01/02/2023 05/31/15   [name], MD  SYNTHROID 100 MCG tablet Take 100 mcg by mouth daily before breakfast.    [provider]  triamterene-hydrochlorothiazide (DYAZIDE) 37.5-25 MG per capsule Take 1 capsule by mouth in the morning.    [provider]      Allergies    Tape and Codeine    Review of Systems   Review of Systems  Constitutional:  Negative for fever.  Respiratory:  Negative for shortness of  breath.   Cardiovascular:  Negative for chest pain.  Gastrointestinal:  Negative for abdominal pain.  Musculoskeletal:  Positive for myalgias.  All other systems reviewed and are negative.   Physical Exam Updated Vital Signs BP (!) 142/89 (BP Location: Right Arm)   Pulse (!) 54   Temp 98.4 F (36.9 C) (Oral)   Resp 18   Ht 5\' 2"  (1.575 m)   Wt 54 kg   SpO2 92%   BMI 21.77 kg/m  Physical Exam Vitals and nursing note reviewed.  Constitutional:      Appearance: Normal appearance.  HENT:     Head: Normocephalic.     Comments: Hematoma present to the right side of her head.     Mouth/Throat:     Mouth: Mucous membranes are moist.  Cardiovascular:      Rate and Rhythm: Normal rate.  Pulmonary:     Effort: Pulmonary effort is normal.  Chest:    Abdominal:     General: Abdomen is flat.  Musculoskeletal:     Cervical back: Normal range of motion and neck supple.  Skin:    General: Skin is warm and dry.  Neurological:     Mental Status: She is alert and oriented to person, place, and time.     ED Results / Procedures / Treatments   Labs (all labs ordered are listed, but only abnormal results are displayed) Labs Reviewed  CBC WITH DIFFERENTIAL/PLATELET - Abnormal; Notable for the following components:      Result Value   Neutro Abs 7.8 (*)    All other components within normal limits  COMPREHENSIVE METABOLIC PANEL - Abnormal; Notable for the following components:   Sodium 133 (*)    Potassium 2.9 (*)    Chloride 94 (*)    Glucose, Bld 141 (*)    Creatinine, Ser 1.18 (*)    GFR, Estimated 43 (*)    All other components within normal limits    EKG None  Radiology CT Chest W Contrast  Result Date: 02/02/2023 CLINICAL DATA:  Fall, right rib pain EXAM: CT CHEST WITH CONTRAST TECHNIQUE: Multidetector CT imaging of the chest was performed during intravenous contrast administration. RADIATION DOSE REDUCTION: This exam was performed according to the departmental dose-optimization program which includes automated exposure control, adjustment of the mA and/or kV according to patient size and/or use of iterative reconstruction technique. CONTRAST:  75mL OMNIPAQUE IOHEXOL 300 MG/ML  SOLN COMPARISON:  Chest x-ray 01/02/2023, lumbar spine x-ray 01/28/2021 FINDINGS: Cardiovascular: Heart size is normal. No pericardial effusion. Thoracic aorta is nonaneurysmal. Extensive atherosclerotic calcifications of the aorta and coronary arteries. Central pulmonary vasculature is nondilated. Mediastinum/Nodes: Mildly enlarged left hilar lymph nodes measuring approximately 11 mm and 8 mm in size (series 4, images 25 and 21). No enlarged mediastinal, right  hilar, or axillary lymph nodes. Moderate-large hiatal hernia. Trachea within normal limits. Lungs/Pleura: Solid lung mass within the periphery of the left upper lobe with irregular margins measuring approximately 3.4 x 2.0 x 2.3 cm (series 6, image 29). Mass abuts the pleural surface. No definite chest wall invasion. No destruction of the traversing second rib. The lung fields are otherwise clear. No additional pulmonary nodules or masses. No pleural effusion or pneumothorax. Mild-moderate centrilobular emphysema. Upper Abdomen: No acute abnormality. Musculoskeletal: Chronic inferior endplate compression fracture of the T12 vertebral body. Subtle superior endplate compression fracture of T11, age indeterminate but could be acute or subacute. There is also a subtle age-indeterminate superior endplate compression deformity of T2. No  lytic or sclerotic bone lesion is identified. IMPRESSION: 1. Solid lung mass within the periphery of the left upper lobe measuring approximately 3.4 x 2.0 x 2.3 cm, highly suspicious for primary lung malignancy. Recommend consultation with pulmonology/multidisciplinary tumor board and follow-up with PET-CT and/or tissue sampling, as clinically appropriate in this patient of advanced age. 2. Mildly enlarged left hilar lymph nodes, suspicious for nodal metastases. 3. Subtle superior endplate compression fracture of T11, age indeterminate but could be acute or subacute. There is also a subtle age-indeterminate superior endplate compression deformity of T2. 4. Chronic inferior endplate compression fracture of the T12 vertebral body. 5. Moderate-large hiatal hernia. Aortic Atherosclerosis (ICD10-I70.0) and Emphysema (ICD10-J43.9). Electronically Signed   By: Duanne Guess D.O.   On: 02/02/2023 17:21   CT HEAD WO CONTRAST ( )  Result Date: 02/02/2023 CLINICAL DATA:  Facial trauma, blunt; Neck trauma (Age >= 65y) EXAM: CT HEAD WITHOUT CONTRAST CT CERVICAL SPINE WITHOUT CONTRAST  TECHNIQUE: Multidetector CT imaging of the head and cervical spine was performed following the standard protocol without intravenous contrast. Multiplanar CT image reconstructions of the cervical spine were also generated. RADIATION DOSE REDUCTION: This exam was performed according to the departmental dose-optimization program which includes automated exposure control, adjustment of the mA and/or kV according to patient size and/or use of iterative reconstruction technique. COMPARISON:  None Available. FINDINGS: CT HEAD FINDINGS Brain: No evidence of acute infarction, hemorrhage, hydrocephalus, extra-axial collection or mass lesion/mass effect. Sequela of mild chronic microvascular ischemic change. Vascular: No hyperdense vessel or unexpected calcification. Skull: Soft tissue swelling along the right parietal scalp. No evidence of an underlying calvarial fracture. Sinuses/Orbits: No middle ear or mastoid effusion. Paranasal sinuses are clear. Bilateral lens replacement. Orbits are otherwise unremarkable. Other: None. CT CERVICAL SPINE FINDINGS Alignment: Trace anterolisthesis of C3 on C4. Skull base and vertebrae: No acute fracture. No primary bone lesion or focal pathologic process. Soft tissues and spinal canal: No prevertebral fluid or swelling. No visible canal hematoma. Disc levels:  No evidence of high-grade spinal canal stenosis. Upper chest: There is a partially imaged nodular opacity in the left upper lobe (series 5, image 1). This is better assessed on same day chest CT Other: None IMPRESSION: 1. No acute intracranial abnormality. Soft tissue swelling along the right parietal scalp. No evidence of an underlying calvarial fracture. 2. No acute fracture or traumatic subluxation of the cervical spine. 3. Partially imaged nodular opacity in the left upper lobe. See same day chest CT for better characterization Electronically Signed   By: Lorenza Cambridge M.D.   On: 02/02/2023 17:16   CT Cervical Spine Wo  Contrast  Result Date: 02/02/2023 CLINICAL DATA:  Facial trauma, blunt; Neck trauma (Age >= 65y) EXAM: CT HEAD WITHOUT CONTRAST CT CERVICAL SPINE WITHOUT CONTRAST TECHNIQUE: Multidetector CT imaging of the head and cervical spine was performed following the standard protocol without intravenous contrast. Multiplanar CT image reconstructions of the cervical spine were also generated. RADIATION DOSE REDUCTION: This exam was performed according to the departmental dose-optimization program which includes automated exposure control, adjustment of the mA and/or kV according to patient size and/or use of iterative reconstruction technique. COMPARISON:  None Available. FINDINGS: CT HEAD FINDINGS Brain: No evidence of acute infarction, hemorrhage, hydrocephalus, extra-axial collection or mass lesion/mass effect. Sequela of mild chronic microvascular ischemic change. Vascular: No hyperdense vessel or unexpected calcification. Skull: Soft tissue swelling along the right parietal scalp. No evidence of an underlying calvarial fracture. Sinuses/Orbits: No middle ear or mastoid effusion. Paranasal sinuses  are clear. Bilateral lens replacement. Orbits are otherwise unremarkable. Other: None. CT CERVICAL SPINE FINDINGS Alignment: Trace anterolisthesis of C3 on C4. Skull base and vertebrae: No acute fracture. No primary bone lesion or focal pathologic process. Soft tissues and spinal canal: No prevertebral fluid or swelling. No visible canal hematoma. Disc levels:  No evidence of high-grade spinal canal stenosis. Upper chest: There is a partially imaged nodular opacity in the left upper lobe (series 5, image 1). This is better assessed on same day chest CT Other: None IMPRESSION: 1. No acute intracranial abnormality. Soft tissue swelling along the right parietal scalp. No evidence of an underlying calvarial fracture. 2. No acute fracture or traumatic subluxation of the cervical spine. 3. Partially imaged nodular opacity in the  left upper lobe. See same day chest CT for better characterization Electronically Signed   By: Lorenza Cambridge M.D.   On: 02/02/2023 17:16    Procedures Procedures    Medications Ordered in ED Medications  acetaminophen (TYLENOL) tablet 650 mg (650 mg Oral Given 02/02/23 1533)  iohexol (OMNIPAQUE) 300 MG/ML solution 75 mL (75 mLs Intravenous Contrast Given 02/02/23 1640)    ED Course/ Medical Decision Making/ A&P                             Medical Decision Making Amount and/or Complexity of Data Reviewed Labs: ordered. Radiology: ordered.  Risk OTC drugs. Prescription drug management.   This patient presents to the ED for concern of fall, this involves a number of treatment options, and is a complaint that carries with it a high risk of complications and morbidity.  The differential diagnosis includes infection, syncope versus trauma.    Co morbidities: Discussed in HPI   Brief History:  See HPI.  EMR reviewed including pt PMHx, past surgical history and past visits to ER.   See HPI for more details   Lab Tests:  I ordered and independently interpreted labs.  The pertinent results include:    Labs notable for CBC with no leukocytosis, hemoglobin is within normal limits.  CMP remarkable for some low potassium of 2.9, creatinine is 1.18, slightly elevated from her baseline however has had previous levels as much.  LFTs are within normal limits.   Imaging Studies:  CT Chest with contrast showed: 1. Solid lung mass within the periphery of the left upper lobe  measuring approximately 3.4 x 2.0 x 2.3 cm, highly suspicious for  primary lung malignancy. Recommend consultation with  pulmonology/multidisciplinary tumor board and follow-up with PET-CT  and/or tissue sampling, as clinically appropriate in this patient of  advanced age.  2. Mildly enlarged left hilar lymph nodes, suspicious for nodal  metastases.  3. Subtle superior endplate compression fracture of T11,  age  indeterminate but could be acute or subacute. There is also a subtle  age-indeterminate superior endplate compression deformity of T2.  4. Chronic inferior endplate compression fracture of the T12  vertebral body.  5. Moderate-large hiatal hernia.   Cardiac Monitoring:  N/A   Medicines ordered:  I ordered medication including tylenol  for pain control Reevaluation of the patient after these medicines showed that the patient improved I have reviewed the patients home medicines and have made adjustments as needed  Reevaluation:  After the interventions noted above I re-evaluated patient and found that they have :stayed the same   Social Determinants of Health:  The patient's social determinants of health were a factor in the  care of this patient  Problem List / ED Course:  Patient here status post mechanical fall which occurred 2 days ago when she was in her kitchen, no blood thinners, no loss of consciousness.  Endorsing pain along the right lower side of her chest, some bruising noted on the area.  She is overall well-appearing.  Vitals are within normal limits although oxygen saturation slightly low at 92%.  Exam is benign, CT head, CT cervical spine without any acute findings.  I did obtain a CT chest rule out rib fracture versus other acute pathology.  CT chest does appear to note a lung mass.  Patient is oxygen saturation is 92%, recently hospitalized for pneumonia, this lung mass was not previously reviewed. Patient ambulate with pulse ox satting at 98%, no signs of respiratory distress.  Patient will go home with outpatient follow-up with oncology.  Hemodynamically stable for discharge. 6:32 PM Call placed to daughter Gavin Pound, who is aware of CT findings.  Dispostion:  After consideration of the diagnostic results and the patients response to treatment, I feel that the patent would benefit from follow up with oncology.     Portions of this note were generated with  Scientist, clinical (histocompatibility and immunogenetics). Dictation errors may occur despite best attempts at proofreading.   Final Clinical Impression(s) / ED Diagnoses Final diagnoses:  Fall, initial encounter  Lung mass    Rx / DC Orders ED Discharge Orders          Ordered    Ambulatory referral to Hematology / Oncology        02/02/23 1736              Claude Manges, PA-C 02/02/23 1814    Claude Manges, PA-C 02/02/23 1835    Tegeler, Canary Brim, MD 02/03/23 1504

## 2023-02-02 NOTE — ED Notes (Signed)
Pt walked from bed to EMS bay without any complaints did use walker! Pt O2 was 95%. PA and RN was notified.

## 2023-02-02 NOTE — ED Triage Notes (Signed)
Pt is coming from home. Pt fell apprx 2 days ago hit the back of her head, and right sided back/rib pain. EMS noted crackles in lung fields.  Denies any LOC or blood thinner use. EMS gave 250 of NS.

## 2023-02-02 NOTE — Discharge Instructions (Addendum)
Your laboratory results were overall within normal limits.  We discussed the findings of your CT chest, you will need to schedule an appointment with hematology/oncology in order to have further follow-up for this lung mass.

## 2023-02-13 ENCOUNTER — Other Ambulatory Visit: Payer: Self-pay | Admitting: Physician Assistant

## 2023-02-13 DIAGNOSIS — R918 Other nonspecific abnormal finding of lung field: Secondary | ICD-10-CM

## 2023-02-13 NOTE — Progress Notes (Unsigned)
[facility] Telephone:([phone]) [phone]   Fax:([phone]) [phone]  CONSULT NOTE  REFERRING PHYSICIAN: Claude Manges PA  REASON FOR CONSULTATION:  Lung Mass   HPI Diamond Holland is a 89 y.o. female past medical history significant for COPD, UTI, AKI, sepsis, cholecystectomy, hysterctomy, borderline diabetes (not on medication), hypothyroidism, HTN, and pneumonia is referred to the clinic for lung mass.  Her workup began when she was hospitalized from 6/11-6/15 after presenting for fatigue, shortness of breath, AMS, and emesis.  She was also hypoxic in the EMS and did not have any history of requiring home oxygen.  She had a chest x-ray on 01/02/2023 which showed ill-defined opacity in the right lung base which could represent airspace disease, effusion, or combination.  There is also questionable left upper lobe nodular density and a CT scan was recommended for characterization.  Her work up showed abscess secondary to E. coli bacteremia from a urinary tract infection, which is what she was ultimately treated for.   The patient was seen back in the emergency room on 02/02/2023 for the chief complaint of fall after losing her balance at home.  The patient had a CT scan of the chest to rule out rib fractures which showed solid lung mass in the periphery of the left upper lobe measuring 3.4 x 2.0 x 2.3 cm, suspicious for primary lung malignancy.  There is also mildly enlarged left hilar lymph nodes suspicious for nodal metastases.  He had a head CT without contrast without any obvious intracranial metastatic disease.  The patient was referred to the clinic for further evaluation and recommendations regarding these findings.  Overall, the patient is feeling well today.  She lives with her daughter.  She reports she has been fairly healthy for most of her life.  She denies any fever, chills, or night sweats.  At baseline, the patient has been struggling with poor appetite for over a year.  She  drinks boost.  She eats small frequent meals as directed by her PCP.  Her PCP recently increased her Protonix to 40 mg daily and probiotics.  She sometimes may have nausea associated with her indigestion.  He has baseline dyspnea on exertion secondary to COPD but reports that she has not noticed any changes in her breathing.  Denies any chest pain, cough, or hemoptysis.  Denies any headache or visual changes.  Denies any unusual diarrhea or constipation.    The patient reports her father and brother had esophageal/gastric cancer secondary to alcohol use.  Her mother was diabetic.   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  She has 2 living children.  She is a former smoker having smoked approximately 55 years averaging half a pack of cigarettes per day.  She quit smoking when she was 89 years old.   HPI  Past Medical History:  Diagnosis Date   Allergy    Arthritis    Asthma    COPD (chronic obstructive pulmonary disease) (HCC)    Diabetes mellitus without complication (HCC)    Emphysema of lung (HCC)    Osteoporosis    Thyroid disease     Past Surgical History:  Procedure Laterality Date   ABDOMINAL HYSTERECTOMY     BREAST BIOPSY Right pt unsure   benign   CHOLECYSTECTOMY      Family History  Problem Relation Age of Onset  Cancer Mother    Heart disease Mother    Cancer Father    Heart disease Father    Cancer Brother    Breast cancer Daughter    Breast cancer Maternal Grandmother     Social History Social History   Tobacco Use   Smoking status: Former  Substance Use Topics   Alcohol use: Not Currently    Allergies  Allergen Reactions   Tape Other (See Comments)    SKIN IS VERY DELICATE AND WILL TEAR AND BRUISE EASILY!!!!   Codeine Other (See Comments)    Stomach pain    Current Outpatient Medications  Medication Sig Dispense Refill   acetaminophen  (TYLENOL) 500 MG tablet Take 500 mg by mouth every 6 (six) hours as needed.     Bempedoic Acid 180 MG TABS Take 1 tablet by mouth every evening.     oxyCODONE-acetaminophen (PERCOCET/ROXICET) 5-325 MG tablet Take 1 tablet by mouth every 4 (four) hours as needed for severe pain.     Probiotic Product (RESTORA PO) Take by mouth.     aspirin EC 81 MG tablet Take 81 mg by mouth daily. Swallow whole.     diphenhydrAMINE (BENADRYL) 25 mg capsule Take 25 mg by mouth every 6 (six) hours as needed for itching, allergies or sleep.     Glucosamine-Chondroitin-MSM 375-300-237.5 MG CAPS Take 1 capsule by mouth daily.     Multiple Vitamins-Minerals (MULTI FOR HER 50+) TABS Take 1 tablet by mouth daily with breakfast.     pantoprazole (PROTONIX) 20 MG tablet Take 20 mg by mouth daily before breakfast.     SYNTHROID 100 MCG tablet Take 100 mcg by mouth daily before breakfast.     triamterene-hydrochlorothiazide (DYAZIDE) 37.5-25 MG per capsule Take 1 capsule by mouth in the morning.     No current facility-administered medications for this visit.    REVIEW OF SYSTEMS:   Review of Systems  Constitutional: Positive for poor appetite and weight loss.  Negative for appetite change, chills, and fever. HENT: Positive for HOH. Negative for mouth sores, nosebleeds, sore throat and trouble swallowing.   Eyes: Negative for eye problems and icterus.  Respiratory: Positive for baseline dyspnea on exertion. Negative for cough, hemoptysis, and wheezing.   Cardiovascular: Negative for chest pain and leg swelling.  Gastrointestinal: Positive for occasional nausea. Negative for abdominal pain, constipation, diarrhea, and vomiting.  Genitourinary: Negative for bladder incontinence, difficulty urinating, dysuria, frequency and hematuria.   Musculoskeletal: Positive for chronic back pain (wearing back brace). Negative for gait problem, neck pain and neck stiffness.  Skin: Negative for itching and rash.  Neurological: Negative  for dizziness, extremity weakness, gait problem, headaches, light-headedness and seizures.  Hematological: Negative for adenopathy. Does not bruise/bleed easily.  Psychiatric/Behavioral: Negative for confusion, depression and sleep disturbance. The patient is not nervous/anxious.     PHYSICAL EXAMINATION:  Blood pressure 138/76, pulse (!) 101, temperature (!) 97.5 F (36.4 C), resp. rate 18, weight 114 lb 12.8 oz (52.1 kg), SpO2 99%.  ECOG PERFORMANCE STATUS: 1-2  Physical Exam  Constitutional: Oriented to person, place, and time and well-developed, well-nourished, and in no distress. Marland Kitchen  HENT:  Head: Normocephalic and atraumatic. Hard of hearing.  Mouth/Throat: Oropharynx is clear and moist. No oropharyngeal exudate.  Eyes: Conjunctivae are normal. Right eye exhibits no discharge. Left eye exhibits no discharge. No scleral icterus.  Neck: Normal range of motion. Neck supple.  Cardiovascular: Normal rate, regular rhythm, normal heart sounds and intact distal pulses.   Pulmonary/Chest:  Effort normal and breath sounds normal. No respiratory distress. No wheezes. No rales.  Abdominal: Soft. Bowel sounds are normal. Exhibits no distension and no mass. There is no tenderness.  Musculoskeletal: Normal range of motion. Exhibits no edema.  Lymphadenopathy:    No cervical adenopathy.  Neurological: Alert and oriented to person, place, and time. Exhibits muscle wasting. She ambulates with walker.  Skin: Skin is warm and dry. No rash noted. Not diaphoretic. No erythema. No pallor.  Psychiatric: Mood, memory and judgment normal.  Vitals reviewed.  LABORATORY DATA: Lab Results  Component Value Date   WBC 9.8 02/14/2023   HGB 12.7 02/14/2023   HCT 38.0 02/14/2023   MCV 92.9 02/14/2023   PLT 370 02/14/2023      Chemistry      Component Value Date/Time   NA 136 02/14/2023 1311   K 3.8 02/14/2023 1311   CL 95 (L) 02/14/2023 1311   CO2 33 (H) 02/14/2023 1311   BUN 21 02/14/2023 1311    CREATININE 1.19 (H) 02/14/2023 1311      Component Value Date/Time   CALCIUM 10.8 (H) 02/14/2023 1311   ALKPHOS 126 02/14/2023 1311   AST 15 02/14/2023 1311   ALT 9 02/14/2023 1311   BILITOT 0.4 02/14/2023 1311       RADIOGRAPHIC STUDIES: CT Chest W Contrast  Result Date: 02/02/2023 CLINICAL DATA:  Fall, right rib pain EXAM: CT CHEST WITH CONTRAST TECHNIQUE: Multidetector CT imaging of the chest was performed during intravenous contrast administration. RADIATION DOSE REDUCTION: This exam was performed according to the departmental dose-optimization program which includes automated exposure control, adjustment of the mA and/or kV according to patient size and/or use of iterative reconstruction technique. CONTRAST:  75mL OMNIPAQUE IOHEXOL 300 MG/ML  SOLN COMPARISON:  Chest x-ray 01/02/2023, lumbar spine x-ray 01/28/2021 FINDINGS: Cardiovascular: Heart size is normal. No pericardial effusion. Thoracic aorta is nonaneurysmal. Extensive atherosclerotic calcifications of the aorta and coronary arteries. Central pulmonary vasculature is nondilated. Mediastinum/Nodes: Mildly enlarged left hilar lymph nodes measuring approximately 11 mm and 8 mm in size (series 4, images 25 and 21). No enlarged mediastinal, right hilar, or axillary lymph nodes. Moderate-large hiatal hernia. Trachea within normal limits. Lungs/Pleura: Solid lung mass within the periphery of the left upper lobe with irregular margins measuring approximately 3.4 x 2.0 x 2.3 cm (series 6, image 29). Mass abuts the pleural surface. No definite chest wall invasion. No destruction of the traversing second rib. The lung fields are otherwise clear. No additional pulmonary nodules or masses. No pleural effusion or pneumothorax. Mild-moderate centrilobular emphysema. Upper Abdomen: No acute abnormality. Musculoskeletal: Chronic inferior endplate compression fracture of the T12 vertebral body. Subtle superior endplate compression fracture of T11, age  indeterminate but could be acute or subacute. There is also a subtle age-indeterminate superior endplate compression deformity of T2. No lytic or sclerotic bone lesion is identified. IMPRESSION: 1. Solid lung mass within the periphery of the left upper lobe measuring approximately 3.4 x 2.0 x 2.3 cm, highly suspicious for primary lung malignancy. Recommend consultation with pulmonology/multidisciplinary tumor board and follow-up with PET-CT and/or tissue sampling, as clinically appropriate in this patient of advanced age. 2. Mildly enlarged left hilar lymph nodes, suspicious for nodal metastases. 3. Subtle superior endplate compression fracture of T11, age indeterminate but could be acute or subacute. There is also a subtle age-indeterminate superior endplate compression deformity of T2. 4. Chronic inferior endplate compression fracture of the T12 vertebral body. 5. Moderate-large hiatal hernia. Aortic Atherosclerosis (ICD10-I70.0) and Emphysema (ICD10-J43.9).  Electronically Signed   By: Duanne Guess D.O.   On: 02/02/2023 17:21   CT HEAD WO CONTRAST ( )  Result Date: 02/02/2023 CLINICAL DATA:  Facial trauma, blunt; Neck trauma (Age >= 65y) EXAM: CT HEAD WITHOUT CONTRAST CT CERVICAL SPINE WITHOUT CONTRAST TECHNIQUE: Multidetector CT imaging of the head and cervical spine was performed following the standard protocol without intravenous contrast. Multiplanar CT image reconstructions of the cervical spine were also generated. RADIATION DOSE REDUCTION: This exam was performed according to the departmental dose-optimization program which includes automated exposure control, adjustment of the mA and/or kV according to patient size and/or use of iterative reconstruction technique. COMPARISON:  None Available. FINDINGS: CT HEAD FINDINGS Brain: No evidence of acute infarction, hemorrhage, hydrocephalus, extra-axial collection or mass lesion/mass effect. Sequela of mild chronic microvascular ischemic change.  Vascular: No hyperdense vessel or unexpected calcification. Skull: Soft tissue swelling along the right parietal scalp. No evidence of an underlying calvarial fracture. Sinuses/Orbits: No middle ear or mastoid effusion. Paranasal sinuses are clear. Bilateral lens replacement. Orbits are otherwise unremarkable. Other: None. CT CERVICAL SPINE FINDINGS Alignment: Trace anterolisthesis of C3 on C4. Skull base and vertebrae: No acute fracture. No primary bone lesion or focal pathologic process. Soft tissues and spinal canal: No prevertebral fluid or swelling. No visible canal hematoma. Disc levels:  No evidence of high-grade spinal canal stenosis. Upper chest: There is a partially imaged nodular opacity in the left upper lobe (series 5, image 1). This is better assessed on same day chest CT Other: None IMPRESSION: 1. No acute intracranial abnormality. Soft tissue swelling along the right parietal scalp. No evidence of an underlying calvarial fracture. 2. No acute fracture or traumatic subluxation of the cervical spine. 3. Partially imaged nodular opacity in the left upper lobe. See same day chest CT for better characterization Electronically Signed   By: Lorenza Cambridge M.D.   On: 02/02/2023 17:16   CT Cervical Spine Wo Contrast  Result Date: 02/02/2023 CLINICAL DATA:  Facial trauma, blunt; Neck trauma (Age >= 65y) EXAM: CT HEAD WITHOUT CONTRAST CT CERVICAL SPINE WITHOUT CONTRAST TECHNIQUE: Multidetector CT imaging of the head and cervical spine was performed following the standard protocol without intravenous contrast. Multiplanar CT image reconstructions of the cervical spine were also generated. RADIATION DOSE REDUCTION: This exam was performed according to the departmental dose-optimization program which includes automated exposure control, adjustment of the mA and/or kV according to patient size and/or use of iterative reconstruction technique. COMPARISON:  None Available. FINDINGS: CT HEAD FINDINGS Brain: No  evidence of acute infarction, hemorrhage, hydrocephalus, extra-axial collection or mass lesion/mass effect. Sequela of mild chronic microvascular ischemic change. Vascular: No hyperdense vessel or unexpected calcification. Skull: Soft tissue swelling along the right parietal scalp. No evidence of an underlying calvarial fracture. Sinuses/Orbits: No middle ear or mastoid effusion. Paranasal sinuses are clear. Bilateral lens replacement. Orbits are otherwise unremarkable. Other: None. CT CERVICAL SPINE FINDINGS Alignment: Trace anterolisthesis of C3 on C4. Skull base and vertebrae: No acute fracture. No primary bone lesion or focal pathologic process. Soft tissues and spinal canal: No prevertebral fluid or swelling. No visible canal hematoma. Disc levels:  No evidence of high-grade spinal canal stenosis. Upper chest: There is a partially imaged nodular opacity in the left upper lobe (series 5, image 1). This is better assessed on same day chest CT Other: None IMPRESSION: 1. No acute intracranial abnormality. Soft tissue swelling along the right parietal scalp. No evidence of an underlying calvarial fracture. 2. No acute fracture or  traumatic subluxation of the cervical spine. 3. Partially imaged nodular opacity in the left upper lobe. See same day chest CT for better characterization Electronically Signed   By: Lorenza Cambridge M.D.   On: 02/02/2023 17:16    ASSESSMENT: This a very pleasant 89 year old Caucasian female with left upper lobe lung mass and mildly enlarged left hilar lymph node suspicious for nodal metastasis.  She requires tissue diagnosis and further staging workup.  This was noted in July 2024.  The patient was seen with Dr. Arbutus Ped today.  Dr. Arbutus Ped had lengthy discussion about her current condition and further workup. If the patient is interested in pursuing further workup, the patient will require tissue diagnosis and further staging with PET scan.   The patient opted to pursue further workup  as her and her daughter would like to know what her treatment options are. As long as she does not have advanced disease, she likely would tolerate radiation.   We will refer the patient to pulmonary medicine to see if they would consider bronchoscopy and biopsy.   Will also arrange for a PET scan to complete the staging workup. Per Dr. Arbutus Ped, we will hold off on ordering brain MRI until after the PET scan.   We will see the patient back for follow-up visit in 3 weeks once we have all of the results of all the studies for more detailed discussion about her current condition and treatment options.    Her calcium was slightly high. Will call her and tell her to discontinue her multivitamin or calcium or to take this every other day instead. She was also instructed to hydrate.   The patient voices understanding of current disease status and treatment options and is in agreement with the current care plan.  All questions were answered. The patient knows to call the clinic with any problems, questions or concerns. We can certainly see the patient much sooner if necessary.  Thank you so much for allowing me to participate in the care of Diamond Holland. I will continue to follow up the patient with you and assist in her care.  Disclaimer: This note was dictated with voice recognition software. Similar sounding words can inadvertently be transcribed and may not be corrected upon review.   Kimbella Heisler L Mardy Hoppe February 14, 2023, 2:58 PM  ADDENDUM: Hematology/Oncology Attending: I had a face-to-face encounter with the patient today.  I reviewed her records, lab, scan and recommended her care plan.  This is a very pleasant 89 years old white female who looks much younger than her age.  She has a past medical history significant for COPD, UTI, sepsis, cholecystectomy, hysterectomy, borderline diabetes, hypothyroidism, hypertension and pneumonia.  The patient was hospitalized in the middle of June  2024 with fatigue shortness of breath as well as mental status change and vomiting.  She was hypoxic and her arrival to the hospital requiring home oxygen.  During her evaluation she had chest x-ray on 01/02/2023 and that showed years ill-defined opacity in the right lung base as reported but there was a suspicious nodule in the left upper lobe.  For some reason she did not have further evaluation for this abnormality but on February 02, 2023 she had a fall with facial trauma.  She did have CT of the head, cervical spine as well as chest and that showed solid lung mass within the periphery of the left upper lobe measuring approximately 3.4 x 2.0 x 2.3 cm highly suspicious for primary lung malignancy.  There was mildly enlarged left hilar lymph nodes suspicious for nodal metastasis.  The patient also has subacute superior endplate compression fracture of T11 of indeterminate age. The patient was referred to me today for evaluation and recommendation regarding her condition. When seen today she is feeling fine with no significant complaints. I had a lengthy discussion with the patient and her daughter about her current condition and additional studies to confirm her diagnosis as well as possible treatment options. The patient is likely to have stage IIb non-small cell lung cancer pending additional workup and biopsy. I gave the patient the option of proceeding with additional staging workup including PET scan as well as referral to pulmonary medicine for bronchoscopy and biopsy versus continuous observation and monitoring taking into consideration her comorbidities and age.  She is still interested in proceeding with additional studies to identify the etiology of her malignancy. We will see her back for follow-up visit in 3 weeks for discussion of the PET scan results as well as the biopsy if done in a timely manner. The patient was advised to call immediately if she has any other concerning symptoms in the  interval. The total time spent in the appointment was 60 minutes. Disclaimer: This note was dictated with voice recognition software. Similar sounding words can inadvertently be transcribed and may be missed upon review. Lajuana Matte, MD

## 2023-02-14 ENCOUNTER — Inpatient Hospital Stay: Payer: Medicare HMO | Attending: Physician Assistant | Admitting: Physician Assistant

## 2023-02-14 ENCOUNTER — Inpatient Hospital Stay: Payer: Medicare HMO

## 2023-02-14 ENCOUNTER — Telehealth: Payer: Self-pay

## 2023-02-14 VITALS — BP 138/76 | HR 101 | Temp 97.5°F | Resp 18 | Wt 114.8 lb

## 2023-02-14 DIAGNOSIS — R911 Solitary pulmonary nodule: Secondary | ICD-10-CM | POA: Diagnosis not present

## 2023-02-14 DIAGNOSIS — Z87891 Personal history of nicotine dependence: Secondary | ICD-10-CM | POA: Insufficient documentation

## 2023-02-14 DIAGNOSIS — Z803 Family history of malignant neoplasm of breast: Secondary | ICD-10-CM | POA: Diagnosis not present

## 2023-02-14 DIAGNOSIS — R590 Localized enlarged lymph nodes: Secondary | ICD-10-CM | POA: Diagnosis not present

## 2023-02-14 DIAGNOSIS — R918 Other nonspecific abnormal finding of lung field: Secondary | ICD-10-CM

## 2023-02-14 DIAGNOSIS — C3490 Malignant neoplasm of unspecified part of unspecified bronchus or lung: Secondary | ICD-10-CM

## 2023-02-14 LAB — CBC WITH DIFFERENTIAL (CANCER CENTER ONLY)
Abs Immature Granulocytes: 0.05 10*3/uL (ref 0.00–0.07)
Basophils Absolute: 0.1 10*3/uL (ref 0.0–0.1)
Basophils Relative: 1 %
Eosinophils Absolute: 0.1 10*3/uL (ref 0.0–0.5)
Eosinophils Relative: 1 %
HCT: 38 % (ref 36.0–46.0)
Hemoglobin: 12.7 g/dL (ref 12.0–15.0)
Immature Granulocytes: 1 %
Lymphocytes Relative: 17 %
Lymphs Abs: 1.7 10*3/uL (ref 0.7–4.0)
MCH: 31.1 pg (ref 26.0–34.0)
MCHC: 33.4 g/dL (ref 30.0–36.0)
MCV: 92.9 fL (ref 80.0–100.0)
Monocytes Absolute: 0.9 10*3/uL (ref 0.1–1.0)
Monocytes Relative: 9 %
Neutro Abs: 7 10*3/uL (ref 1.7–7.7)
Neutrophils Relative %: 71 %
Platelet Count: 370 10*3/uL (ref 150–400)
RBC: 4.09 MIL/uL (ref 3.87–5.11)
RDW: 13.2 % (ref 11.5–15.5)
WBC Count: 9.8 10*3/uL (ref 4.0–10.5)
nRBC: 0 % (ref 0.0–0.2)

## 2023-02-14 LAB — CMP (CANCER CENTER ONLY)
ALT: 9 U/L (ref 0–44)
AST: 15 U/L (ref 15–41)
Albumin: 4 g/dL (ref 3.5–5.0)
Alkaline Phosphatase: 126 U/L (ref 38–126)
Anion gap: 8 (ref 5–15)
BUN: 21 mg/dL (ref 8–23)
CO2: 33 mmol/L — ABNORMAL HIGH (ref 22–32)
Calcium: 10.8 mg/dL — ABNORMAL HIGH (ref 8.9–10.3)
Chloride: 95 mmol/L — ABNORMAL LOW (ref 98–111)
Creatinine: 1.19 mg/dL — ABNORMAL HIGH (ref 0.44–1.00)
GFR, Estimated: 42 mL/min — ABNORMAL LOW (ref >60)
Glucose, Bld: 121 mg/dL — ABNORMAL HIGH (ref 70–99)
Potassium: 3.8 mmol/L (ref 3.5–5.1)
Sodium: 136 mmol/L (ref 135–145)
Total Bilirubin: 0.4 mg/dL (ref 0.3–1.2)
Total Protein: 7.2 g/dL (ref 6.5–8.1)

## 2023-02-14 NOTE — Progress Notes (Signed)
Diamond Holland met with the pt face to face today at her initial appt with C.Heilingoetter, Onc PA And Dr.Mohamed. Pt was accompanied by her daughter, Diamond Holland.  Plan for now is for patient to have a PET scan and a referral to pulmonology to arrange for a biopsy. Diamond Holland offered to schedule the PET scan for the pt, however Debbie requested that she do the scheduling herself. Diamond Holland provided number for central scheduling to Debbie. Diamond Holland states she will most likely wait to schedule the PET until after the appt with pulmonology has been made. Diamond Holland verbalized understanding. Business card with contact information provided to [place]. All questions answered.

## 2023-02-14 NOTE — Telephone Encounter (Signed)
This nurse reached out to patients daughter.  Left a message and advised that a My Chart message will be sent.  With lab results and provider recommendations.  No further concerns noted at this time.

## 2023-02-21 NOTE — Progress Notes (Signed)
NN spoke to pt's daughter, Eunice Blase. Per Eunice Blase, pt doesn't want to be bothered by oncology team any longer and does not want to pursue further work up for her lung mass. NN verbalized understanding and support for the pts decisions and let Debbie know she can always reach out to NN if the pt changes her mind. Debbie verbalized appreciation. No further action needed at this time.

## 2023-02-22 ENCOUNTER — Other Ambulatory Visit: Payer: Self-pay | Admitting: Physician Assistant

## 2023-03-06 ENCOUNTER — Ambulatory Visit: Payer: Medicare HMO | Admitting: Physician Assistant

## 2023-03-06 ENCOUNTER — Other Ambulatory Visit: Payer: Medicare HMO

## 2023-03-07 ENCOUNTER — Other Ambulatory Visit: Payer: Medicare HMO

## 2023-03-07 ENCOUNTER — Ambulatory Visit: Payer: Medicare HMO | Admitting: Physician Assistant

## 2023-03-14 ENCOUNTER — Institutional Professional Consult (permissible substitution): Payer: Medicare HMO | Admitting: Pulmonary Disease

## 2023-10-23 DEATH — deceased
